# Patient Record
Sex: Female | Born: 1940 | Hispanic: No | Marital: Single | Smoking: Never smoker
Health system: Southern US, Community
[De-identification: ages and names within clinical notes are randomized; demographics above are authoritative.]

## PROBLEM LIST (undated history)

## (undated) DIAGNOSIS — F32A Depression, unspecified: Secondary | ICD-10-CM

## (undated) DIAGNOSIS — M810 Age-related osteoporosis without current pathological fracture: Secondary | ICD-10-CM

## (undated) DIAGNOSIS — M1990 Unspecified osteoarthritis, unspecified site: Secondary | ICD-10-CM

## (undated) DIAGNOSIS — I10 Essential (primary) hypertension: Secondary | ICD-10-CM

## (undated) DIAGNOSIS — F329 Major depressive disorder, single episode, unspecified: Secondary | ICD-10-CM

## (undated) DIAGNOSIS — K635 Polyp of colon: Secondary | ICD-10-CM

## (undated) HISTORY — DX: Polyp of colon: K63.5

## (undated) HISTORY — DX: Age-related osteoporosis without current pathological fracture: M81.0

## (undated) HISTORY — PX: BREAST SURGERY: SHX581

## (undated) HISTORY — DX: Major depressive disorder, single episode, unspecified: F32.9

## (undated) HISTORY — PX: APPENDECTOMY: SHX54

## (undated) HISTORY — DX: Essential (primary) hypertension: I10

## (undated) HISTORY — DX: Depression, unspecified: F32.A

## (undated) HISTORY — DX: Unspecified osteoarthritis, unspecified site: M19.90

---

## 1998-06-27 ENCOUNTER — Other Ambulatory Visit: Admission: RE | Admit: 1998-06-27 | Discharge: 1998-06-27 | Payer: Self-pay | Admitting: Gynecology

## 1999-07-28 ENCOUNTER — Other Ambulatory Visit: Admission: RE | Admit: 1999-07-28 | Discharge: 1999-07-28 | Payer: Self-pay | Admitting: Gynecology

## 2001-03-16 ENCOUNTER — Other Ambulatory Visit: Admission: RE | Admit: 2001-03-16 | Discharge: 2001-03-16 | Payer: Self-pay | Admitting: Gynecology

## 2007-12-30 ENCOUNTER — Ambulatory Visit: Payer: Self-pay | Admitting: Gastroenterology

## 2007-12-30 LAB — CONVERTED CEMR LAB
Basophils Absolute: 0 10*3/uL (ref 0.0–0.1)
Eosinophils Absolute: 0.2 10*3/uL (ref 0.0–0.6)
Eosinophils Relative: 2.2 % (ref 0.0–5.0)
Folate: 15.5 ng/mL
MCHC: 33.2 g/dL (ref 30.0–36.0)
Monocytes Absolute: 1.1 10*3/uL — ABNORMAL HIGH (ref 0.2–0.7)
Neutrophils Relative %: 36.3 % — ABNORMAL LOW (ref 43.0–77.0)
RBC: 4.82 M/uL (ref 3.87–5.11)
RDW: 13.7 % (ref 11.5–14.6)
Saturation Ratios: 30.3 % (ref 20.0–50.0)
Vitamin B-12: 470 pg/mL (ref 211–911)

## 2008-01-11 ENCOUNTER — Encounter (INDEPENDENT_AMBULATORY_CARE_PROVIDER_SITE_OTHER): Payer: Self-pay | Admitting: *Deleted

## 2008-01-11 ENCOUNTER — Ambulatory Visit: Payer: Self-pay | Admitting: Gastroenterology

## 2008-01-11 ENCOUNTER — Encounter: Payer: Self-pay | Admitting: Gastroenterology

## 2008-11-13 ENCOUNTER — Encounter: Payer: Self-pay | Admitting: Gynecology

## 2008-11-13 ENCOUNTER — Other Ambulatory Visit: Admission: RE | Admit: 2008-11-13 | Discharge: 2008-11-13 | Payer: Self-pay | Admitting: Gynecology

## 2008-11-13 ENCOUNTER — Ambulatory Visit: Payer: Self-pay | Admitting: Gynecology

## 2008-11-13 ENCOUNTER — Encounter: Payer: Self-pay | Admitting: Gastroenterology

## 2008-11-20 ENCOUNTER — Encounter: Payer: Self-pay | Admitting: Gastroenterology

## 2008-11-20 ENCOUNTER — Ambulatory Visit: Payer: Self-pay | Admitting: Gynecology

## 2008-11-20 ENCOUNTER — Telehealth: Payer: Self-pay | Admitting: Gastroenterology

## 2008-11-22 ENCOUNTER — Encounter: Payer: Self-pay | Admitting: Gastroenterology

## 2008-11-23 ENCOUNTER — Encounter (INDEPENDENT_AMBULATORY_CARE_PROVIDER_SITE_OTHER): Payer: Self-pay | Admitting: *Deleted

## 2008-11-23 ENCOUNTER — Ambulatory Visit (HOSPITAL_COMMUNITY): Admission: RE | Admit: 2008-11-23 | Discharge: 2008-11-23 | Payer: Self-pay | Admitting: Gynecology

## 2008-11-27 ENCOUNTER — Telehealth (INDEPENDENT_AMBULATORY_CARE_PROVIDER_SITE_OTHER): Payer: Self-pay | Admitting: *Deleted

## 2008-12-03 DIAGNOSIS — K644 Residual hemorrhoidal skin tags: Secondary | ICD-10-CM | POA: Insufficient documentation

## 2008-12-03 DIAGNOSIS — I10 Essential (primary) hypertension: Secondary | ICD-10-CM | POA: Insufficient documentation

## 2008-12-03 DIAGNOSIS — R740 Nonspecific elevation of levels of transaminase and lactic acid dehydrogenase [LDH]: Secondary | ICD-10-CM

## 2008-12-03 DIAGNOSIS — D126 Benign neoplasm of colon, unspecified: Secondary | ICD-10-CM

## 2008-12-03 DIAGNOSIS — K5730 Diverticulosis of large intestine without perforation or abscess without bleeding: Secondary | ICD-10-CM | POA: Insufficient documentation

## 2008-12-03 DIAGNOSIS — R7401 Elevation of levels of liver transaminase levels: Secondary | ICD-10-CM | POA: Insufficient documentation

## 2008-12-03 DIAGNOSIS — M129 Arthropathy, unspecified: Secondary | ICD-10-CM | POA: Insufficient documentation

## 2008-12-06 ENCOUNTER — Ambulatory Visit: Payer: Self-pay | Admitting: Gastroenterology

## 2008-12-06 DIAGNOSIS — K625 Hemorrhage of anus and rectum: Secondary | ICD-10-CM | POA: Insufficient documentation

## 2008-12-06 DIAGNOSIS — K648 Other hemorrhoids: Secondary | ICD-10-CM | POA: Insufficient documentation

## 2008-12-11 ENCOUNTER — Ambulatory Visit: Payer: Self-pay | Admitting: Gynecology

## 2009-01-22 ENCOUNTER — Ambulatory Visit: Payer: Self-pay | Admitting: Gastroenterology

## 2009-02-01 ENCOUNTER — Ambulatory Visit: Payer: Self-pay | Admitting: Gastroenterology

## 2009-02-15 ENCOUNTER — Ambulatory Visit: Payer: Self-pay | Admitting: Gynecology

## 2009-02-28 ENCOUNTER — Ambulatory Visit: Payer: Self-pay | Admitting: Gynecology

## 2009-07-09 ENCOUNTER — Encounter: Admission: RE | Admit: 2009-07-09 | Discharge: 2009-07-09 | Payer: Self-pay | Admitting: Family Medicine

## 2009-11-14 ENCOUNTER — Other Ambulatory Visit: Admission: RE | Admit: 2009-11-14 | Discharge: 2009-11-14 | Payer: Self-pay | Admitting: Gynecology

## 2009-11-14 ENCOUNTER — Ambulatory Visit: Payer: Self-pay | Admitting: Gynecology

## 2010-02-12 ENCOUNTER — Encounter (INDEPENDENT_AMBULATORY_CARE_PROVIDER_SITE_OTHER): Payer: Self-pay | Admitting: *Deleted

## 2010-10-21 ENCOUNTER — Emergency Department (HOSPITAL_COMMUNITY)
Admission: EM | Admit: 2010-10-21 | Discharge: 2010-10-21 | Payer: Self-pay | Source: Home / Self Care | Admitting: Emergency Medicine

## 2010-12-09 NOTE — Procedures (Signed)
Summary: colonoscopy  Patient Name: Mary Hunt, Mary Hunt MRN:  Procedure Procedures: Colonoscopy CPT: 14782.  Personnel: Endoscopist: Vania Rea. Jarold Motto, MD.  Referred By: Laban Emperor. Cloward, MD.  Exam Location: Exam performed in Outpatient Clinic. Outpatient  Patient Consent: Procedure, Alternatives, Risks and Benefits discussed, consent obtained, from patient. Consent was obtained by the RN.  Indications Symptoms: Hematochezia.  History  Current Medications: Patient is not currently taking Coumadin.  Allergies: Allergic to PCN.  Pre-Exam Physical: Performed Jan 11, 2008. Entire physical exam was normal.  Comments: Pt. history reviewed/updated, physical exam performed prior to initiation of sedation? yes Exam Exam: Extent of exam reached: Cecum, extent intended: Cecum.  The cecum was identified by appendiceal orifice and IC valve. Patient position: on left side. Time to Cecum: 00:05:09. Time for Withdrawl: 00:10:58. Colon retroflexion performed. Images taken. ASA Classification: I. Tolerance: excellent.  Monitoring: Pulse and BP monitoring, Oximetry used. Supplemental O2 given. at 2 Liters.  Colon Prep Used Golytely for colon prep. Prep results: poor.  Sedation Meds: Patient assessed and found to be appropriate for moderate (conscious) sedation. Sedation was managed by the Endoscopist. Fentanyl 50 mcg. given IV. Versed 7 mg. given IV.  Instrument(s): CF 140L. Serial [id number].  Findings - DIVERTICULOSIS: Cecum to Transverse Colon. Not bleeding. ICD9: Diverticulosis, Colon: 562.10.  - DIVERTICULOSIS: Descending Colon to Sigmoid Colon. Not bleeding. ICD9: Diverticulosis, Colon: 562.10.  - MULTIPLE POLYPS: Descending Colon to Sigmoid Colon. minimum size 3 mm, maximum size 7 mm. Procedure:  snare with cautery, removed, Polyp retrieved, Polyps sent to pathology.  - POLYP: Ascending Colon, Maximum size: 3 mm. sessile polyp. Procedure:  snare with cautery, removed, not  retrieved, ICD9: Colon Polyps: 211.3.  - OTHER FINDING: Cecum. Comments: Solid feces in cecum...poor exam...  - HEMORRHOIDS: External. Size: Medium. Not bleeding. Not thrombosed. ICD9: Hemorrhoids, External: 455.3.    Comments: Sigmoid full of feces... Assessment  Diagnoses: 211.3: Colon Polyps.  562.10: Diverticulosis, Colon.  455.3: Hemorrhoids, External.   Comments: Multiple polyps and limited exam per POOR PREP... Events  Unplanned Interventions: No intervention was required.  Plans  Post Exam Instructions: No aspirin or non-steroidal containing medications: 2 weeks.  Medication Plan: Await pathology. Continue current medications.  Patient Education: Patient given standard instructions for: Polyps. Diverticulosis. Hemorrhoids. Patient instructed to get routine colonoscopy every 1 years.  Disposition: After procedure patient sent to recovery. After recovery patient sent home.  Scheduling/Referral: Follow-Up prn. Await pathology to schedule patient.

## 2010-12-09 NOTE — Letter (Signed)
Summary: Appointment - Missed  [facility], Main Office  [address]   [place], [place] [address]   Phone: [phone]  Fax: [phone]     February 12, 2010 MRN: [id number]   Mary Hunt [address] [place], [place]  [address]   Dear Mary Hunt,  Our records indicate you missed your appointment on 02/06/2010 with Dr. Shirlee Latch. It is very important that we reach you to reschedule this appointment. We look forward to participating in your health care needs. Please contact us at the number listed above at your earliest convenience to reschedule this appointment.     Sincerely,   Migdalia Dk [facility] Scheduling Team

## 2011-01-20 LAB — BASIC METABOLIC PANEL
BUN: 13 mg/dL (ref 6–23)
Chloride: 106 mEq/L (ref 96–112)
Creatinine, Ser: 0.8 mg/dL (ref 0.4–1.2)
GFR calc non Af Amer: >60 mL/min (ref >60)
Glucose, Bld: 101 mg/dL — ABNORMAL HIGH (ref 70–99)
Potassium: 3.9 mEq/L (ref 3.5–5.1)
Sodium: 139 mEq/L (ref 135–145)

## 2011-01-20 LAB — CBC
HCT: 42.1 % (ref 36.0–46.0)
MCHC: 32.3 g/dL (ref 30.0–36.0)
RBC: 4.89 MIL/uL (ref 3.87–5.11)
WBC: 10 10*3/uL (ref 4.0–10.5)

## 2011-01-20 LAB — DIFFERENTIAL
Basophils Absolute: 0.1 10*3/uL (ref 0.0–0.1)
Eosinophils Absolute: 0.1 10*3/uL (ref 0.0–0.7)
Eosinophils Relative: 1 % (ref 0–5)
Lymphocytes Relative: 43 % (ref 12–46)
Monocytes Absolute: 0.9 10*3/uL (ref 0.1–1.0)
Neutrophils Relative %: 47 % (ref 43–77)

## 2011-01-20 LAB — POCT CARDIAC MARKERS

## 2011-03-24 NOTE — Assessment & Plan Note (Signed)
[facility]                         GASTROENTEROLOGY OFFICE NOTE   Mary Hunt                            MRN:          [id number]  DATE:12/30/2007                            DOB:          04-10-1941    Mary Hunt is a 70 year old [place] female, referred for evaluation of  asymptomatic rectal bleeding over the last year.   Mary Hunt does not understand or speak English and her interview  instructions are related through her daughter, who speaks fluent  [place].   Mary Hunt has had some intermittent asymptomatic rectal bleeding for the  last year without abdominal or rectal pain.  She had been using over-the-  counter Preparation H because of hemorrhoids with mild improvement.  She denies any upper GI or hepatobiliary complaints or other medical  problems of note.  She does have a history of suspected essential  hypertension, hypercholesterolemia, degenerative arthritis, has had  previous benign breast surgery and also has had a previous hysterectomy,  many years ago.   She denies abdominal pain, bowel irregularity, fever, chills or other  systemic problems.  She is followed at [facility] and has been  prescribed p.r.n. Restoril.  Recent urinalysis and lipid profile were  fairly unremarkable, including urine culture.  I cannot see results of a  CBC or liver profile.   MEDICATIONS:  Tylenol arthritis and p.r.n. ibuprofen.   She has a previous history of PENICILLIN allergy.   FAMILY HISTORY:  Noncontributory.  She has a daughter who is diabetic  and is on dialysis.   SOCIAL HISTORY:  She is single and lives with her daughter.  She has a  high school education.  She does not smoke or use ethanol.   REVIEW OF SYSTEMS:  Except for diffuse arthralgias, is unremarkable.  She denies cardiovascular, pulmonary, neurologic or psychiatric problem.   EXAM:  She is a healthy-appearing, Hispanic female, appearing her stated  age.  She is 5 feet  tall and weighs 161 pounds.  Blood pressure is 138/84 and  pulse was 80 and regular.  I could not appreciate stigmata of chronic liver disease or thyromegaly.  Her chest was clear and she was in a regular rhythm without murmurs,  gallops or rubs.  I could not appreciate hepatosplenomegaly, abdominal masses or  tenderness.  Bowel sounds were normal.  Mental status was clear.  Peripheral extremities were normal.  Inspection of the rectum was unremarkable, as was rectal exam.  I could  not appreciate any fissures or fistulae.  She did have a posterior  external hemorrhoid.  There were no rectal masses or tenderness and  stool was guaiac-negative.   ASSESSMENT:  I suspect Mary Hunt is having some intermittent  hemorrhoidal  rectal bleeding, but she needs colonoscopy to exclude  rectosigmoid polyps or carcinoma.   RECOMMENDATIONS:  1. Local anal care and Analpram cream.  2. Check CBC and anemia profile.  3. Outpatient colonoscopy at her convenience.     Vania Rea. Jarold Motto, MD, Caleen Essex, FAGA  Electronically Signed    DRP/MedQ  DD: 12/30/2007  DT: 12/30/2007  Job #: [id number]   cc:   Gabriel Earing, M.D.

## 2011-04-30 ENCOUNTER — Other Ambulatory Visit: Payer: Self-pay | Admitting: Gynecology

## 2011-04-30 ENCOUNTER — Other Ambulatory Visit (HOSPITAL_COMMUNITY)
Admission: RE | Admit: 2011-04-30 | Discharge: 2011-04-30 | Disposition: A | Discharge status: Home / Self Care | Payer: Medicare Other | Source: Ambulatory Visit | Attending: Gynecology | Admitting: Gynecology

## 2011-04-30 ENCOUNTER — Encounter (INDEPENDENT_AMBULATORY_CARE_PROVIDER_SITE_OTHER): Payer: Medicare Other | Admitting: Gynecology

## 2011-04-30 DIAGNOSIS — Z124 Encounter for screening for malignant neoplasm of cervix: Secondary | ICD-10-CM

## 2011-04-30 DIAGNOSIS — N952 Postmenopausal atrophic vaginitis: Secondary | ICD-10-CM

## 2011-04-30 DIAGNOSIS — R635 Abnormal weight gain: Secondary | ICD-10-CM

## 2011-04-30 DIAGNOSIS — Z1211 Encounter for screening for malignant neoplasm of colon: Secondary | ICD-10-CM

## 2011-05-07 ENCOUNTER — Encounter (INDEPENDENT_AMBULATORY_CARE_PROVIDER_SITE_OTHER): Payer: Medicare Other

## 2011-05-07 DIAGNOSIS — M949 Disorder of cartilage, unspecified: Secondary | ICD-10-CM

## 2011-05-07 DIAGNOSIS — E559 Vitamin D deficiency, unspecified: Secondary | ICD-10-CM

## 2011-05-08 ENCOUNTER — Other Ambulatory Visit: Payer: Medicare Other

## 2011-06-09 ENCOUNTER — Ambulatory Visit (INDEPENDENT_AMBULATORY_CARE_PROVIDER_SITE_OTHER): Payer: Medicare Other | Admitting: Gynecology

## 2011-06-09 ENCOUNTER — Encounter: Payer: Self-pay | Admitting: Gynecology

## 2011-06-09 VITALS — BP 128/86

## 2011-06-09 DIAGNOSIS — M899 Disorder of bone, unspecified: Secondary | ICD-10-CM

## 2011-06-09 DIAGNOSIS — M8580 Other specified disorders of bone density and structure, unspecified site: Secondary | ICD-10-CM

## 2011-06-09 DIAGNOSIS — L309 Dermatitis, unspecified: Secondary | ICD-10-CM

## 2011-06-09 DIAGNOSIS — L259 Unspecified contact dermatitis, unspecified cause: Secondary | ICD-10-CM

## 2011-06-09 DIAGNOSIS — F419 Anxiety disorder, unspecified: Secondary | ICD-10-CM

## 2011-06-09 DIAGNOSIS — M949 Disorder of cartilage, unspecified: Secondary | ICD-10-CM

## 2011-06-09 DIAGNOSIS — F411 Generalized anxiety disorder: Secondary | ICD-10-CM

## 2011-06-09 MED ORDER — ALENDRONATE SODIUM 70 MG PO TABS: 70.0000 mg | ORAL_TABLET | ORAL | Status: DC

## 2011-06-09 MED ORDER — ALPRAZOLAM 0.25 MG PO TABS: 0.2500 mg | ORAL_TABLET | Freq: Every evening | ORAL | Status: AC | PRN

## 2011-06-09 MED ORDER — HYDROCORTISONE VALERATE 0.2 % EX OINT: TOPICAL_OINTMENT | Freq: Two times a day (BID) | CUTANEOUS | Status: AC

## 2011-06-09 NOTE — Progress Notes (Signed)
Patient presented to the office to discuss several issues #1 she noticed a growth from the inner thigh a few weeks ago. On inspection appears to be a skin tag. The area was secured with 4-0 Vicryl suture in a explained to the patient that this will sloughf of by itself. #2 she was complaining of this rash on the lateral aspects of both forearms left greater than right it appears to be some form of either a contact dermatitis early signs of psoriasis. She will be placed on Westcort topical steroid cream to apply to 3 times a day for the next 5-7 days if there's no improvement she'll be referred to a dermatologist for further evaluation. Third issue discussed today was her bone density study and her history of vitamin D deficiency. She is taking vitamin D 2000 units q. daily as previously recommended and her most recent vitamin D level was normal at 33. Her bone density study was done here in our office on June 28 was compared with the previous skin of February 2010 her lowest T score in the AP spine was -2.2 and she had significant decrease on her bone marrow densities on the left and right hip respectively of greater than 5%. Since she is 70 years of age regardless of the thorax analysis demonstrated some partial values were going to initiate antiresorptive agent such as Fosamax 70 mg one by mouth weekly and will followup in the bone density study in one week. The risks benefits and pros and cons of as a result the cages were discussed with the patient and literature formation in Spanish was provided on osteoporosis. I've explained to her that she needs to continue to engage and weight-bearing exercise 30-45 minutes 3-4 times a week and some lichenification is 3-4 times a week will follow up with a bone density study in one year to monitor response to treatment albeit above was provided in Spanish in written format the patient all questions are as her we'll follow accordingly.

## 2011-06-09 NOTE — Patient Instructions (Signed)
Tomar la vitamina D 2,000 unidades una tableta diaria. El Fosomax es para los huesos tambien y te tomas una tableta semanal primera hora del dia antes de comer o tomar algo por 30 minutos y no acostarte.Toula Moos crema para la piel aplicar dos a tres veces al dia. El xanx es para la ansiedad te puedes tomar una diaria o dos veces al dia. Buena suerte en to examen!

## 2011-07-25 ENCOUNTER — Other Ambulatory Visit: Payer: Self-pay | Admitting: Gynecology

## 2011-07-27 ENCOUNTER — Telehealth: Payer: Self-pay | Admitting: *Deleted

## 2011-07-27 NOTE — Telephone Encounter (Signed)
Message copied by Aura Camps on Mon Jul 27, 2011 12:24 PM ------      Message from: Ok Edwards      Created: Mon Jul 27, 2011 12:18 PM       Mary Hunt, please call in prescription refill for one month for this patient. Please inform her that the physician who put her on her hypertensive medication should be the one to refill those medications for her.

## 2011-07-27 NOTE — Telephone Encounter (Signed)
Spoke with to granddaughter regarding the below message. She called pharmacy requesting rx. She will tell the pt the below note.

## 2011-08-04 ENCOUNTER — Other Ambulatory Visit: Payer: Self-pay | Admitting: *Deleted

## 2011-08-04 NOTE — Telephone Encounter (Signed)
Pharmacy faxed over request for test strips for blood sugar. Called pharmacy to let them know that Rx should go to PCP.

## 2011-11-17 ENCOUNTER — Encounter: Payer: Self-pay | Admitting: Gynecology

## 2012-06-11 ENCOUNTER — Other Ambulatory Visit: Payer: Self-pay | Admitting: Gynecology

## 2012-10-03 ENCOUNTER — Other Ambulatory Visit: Payer: Self-pay | Admitting: Gynecology

## 2012-10-12 ENCOUNTER — Encounter: Payer: Self-pay | Admitting: Gynecology

## 2012-10-12 ENCOUNTER — Ambulatory Visit (INDEPENDENT_AMBULATORY_CARE_PROVIDER_SITE_OTHER): Payer: Medicare Other | Admitting: Gynecology

## 2012-10-12 VITALS — BP 124/76 | Ht <= 58 in | Wt 149.0 lb

## 2012-10-12 DIAGNOSIS — M8580 Other specified disorders of bone density and structure, unspecified site: Secondary | ICD-10-CM

## 2012-10-12 DIAGNOSIS — L409 Psoriasis, unspecified: Secondary | ICD-10-CM | POA: Insufficient documentation

## 2012-10-12 DIAGNOSIS — R51 Headache: Secondary | ICD-10-CM

## 2012-10-12 DIAGNOSIS — L408 Other psoriasis: Secondary | ICD-10-CM

## 2012-10-12 DIAGNOSIS — N951 Menopausal and female climacteric states: Secondary | ICD-10-CM

## 2012-10-12 DIAGNOSIS — M899 Disorder of bone, unspecified: Secondary | ICD-10-CM

## 2012-10-12 DIAGNOSIS — Z23 Encounter for immunization: Secondary | ICD-10-CM

## 2012-10-12 MED ORDER — ALENDRONATE SODIUM 70 MG PO TABS: 70.0000 mg | ORAL_TABLET | ORAL | Status: DC

## 2012-10-12 MED ORDER — IBUPROFEN 600 MG PO TABS: 600.0000 mg | ORAL_TABLET | Freq: Three times a day (TID) | ORAL | Status: AC | PRN

## 2012-10-12 MED ORDER — ASPIRIN 81 MG PO TABS: 81.0000 mg | ORAL_TABLET | Freq: Every day | ORAL | Status: DC

## 2012-10-12 NOTE — Patient Instructions (Signed)
Vacuna desactivada contra la influenza, Lo que usted necesita saber (Inactivated Influenza Vaccine, What You Need to Know) POR QU VACUNARSE?  La influenza (conocida como gripe o "flu") es una enfermedad contagiosa.  Es causada por el virus de la influenza, que se puede transmitir al toser, Engineering geologist o mediante las secreciones nasales.  A cualquiera le puede dar influenza, pero los ndices de infeccin son [employer]Ex nios. La Harley[employer]son de las personas solo experimentan sntomas por unos pocos das e incluyen:  Teacher, English as a foreign language o escalofros.  Dolor de Advertising copywriter.  Dolores musculares.  Cansancio.  Tos.  Dolor de [place].  Nariz moquienta o congestionada. Otras enfermedades pueden DIRECTV mismos sntomas y a menudo se confunden con la influenza. Los nios pequeos, las Smith International de 65 aos de edad, las mujeres embarazadas y las personas con ciertas condiciones de salud, como enfermedades del corazn, pulmn o rin o un sistema inmunolgico debilitado, se pueden enfermar mucho ms. La influenza puede causar fiebre alta y neumona y puede empeorar condiciones de salud preexistentes. Puede causar diarrea y convulsiones en los nios. Miles de personas mueren cada ao por la influenza y muchas ms requieren hospitalizacin. Si se vacuna, puede protegerse usted mismo y Arts administrator a otros. VACUNA DESACTIVADA CONTRA LA INFLUENZA  Hay dos tipos de vacuna contra la influenza:  La vacuna inactivada (el virus est inactivo), de la "vacuna contra la gripe" se aplica con una aguja.  La vacuna viva atenuada (debilitado), que see aplica como roco en las fosas nasales. Esta vacuna se describe en una Hoja de Informacin sobre las [place], por separado. Hay una "dosis ms alta" de vacuna desactivada disponible para personas mayores de 65 aos. Para ms informacin, consulte a su doctor.  Cada ao los cientficos tratan de que los virus de la vacuna coincidan con los que tienen ms  probabilidades de causar la influenza ese ao. La vacuna contra la influenza no prevendr otras enfermedades causadas por otros virus, incluyendo los virus de influenza que no estn incluidos en la vacuna. Despus de la vacunacin, toma hasta 2 semanas para desarrollar proteccin. La proteccin dura hasta un ao. Algunas vacunas desactivadas contra la influenza contienen un conservante llamado timerosal. La vacuna libre de timerosal tambin est disponible. Consulte a su doctor para ms informacin. QUINES DEBEN RECIBIR LA VACUNA DESACTIVADA CONTRA LA INFLUENZA Y CUNDO? QUINES  Todas las Smith International de 6 meses de edad deben recibir la vacuna contra la influenza.  La vacunacin es especialmente importante para las personas con mayor riesgo de experimentar un caso grave de influenza y las que estn en contacto directo con ellas, incluyendo al personal mdico, y las personas en contacto cercano con bebs menores de 6 meses de edad. CUNDO Reciba la vacuna tan pronto como est disponible. Esto le dar la proteccin necesaria en caso de que la temporada de influenza llegue temprano. Puede vacunarse durante todo el tiempo en el que la enfermedad siga ocurriendo en su comunidad. La influenza puede ocurrir a Customer service manager, pero la [place] de influenza ocurre desde octubre [employer] Corporation. En las ltimas temporadas, la mayora de las infecciones han ocurrido en enero y Research scientist (physical sciences). Vacunndose Science Applications International, o an despus, ser beneficioso en casi todos los [place]. Los adultos y los nios mayores requieren una dosis de la vacuna contra la influenza cada ao. Sin embargo, algunos nios menores de 9 aos de edad [address] dosis para estar protegidos. Consulte a su doctor. Se puede dar la vacuna contra la influenza a la misma  vez que otras vacunas, incluyendo la vacuna antineumoccica. ALGUNAS PERSONAS NO DEBEN RECIBIR LA VACUNA DESACTIVADA CONTRA LA INFLUENZA O DEBEN ESPERAR  Diga a su doctor si tiene  cualquier alergia grave (que amenaza la vida), incluyendo alergia grave a los [place]. Una grave alergia a cualquier componente de la vacuna puede ser razn para no vacunarse. Las Therapist, art a la vacuna contra la influenza son poco comunes.  Diga a su doctor si alguna vez ha tenido una reaccin grave despus de haber recibido una dosis de la vacuna contra la influenza.  Diga a su doctor si alguna vez ha tenido el sndrome de Pension scheme manager (una enfermedad paraltica grave, tambin conocida como GBS). Su doctor le puede ayudar a decidir si es recomendable vacunarse.  Las personas moderadamente o muy enfermas por lo general deben esperar hasta recuperarse antes de vacunarse contra la influenza. Si est enfermo, hable con su doctor sobre si debe cambiar la cita para vacunarse. Las personas con una enfermedad leve por lo general se pueden vacunar. CULES SON LOS RIESGOS DE LA VACUNA DESACTIVADA CONTRA LA INFLUENZA? Los vacunas, como cualquier [place], pueden causar problemas serios, como Therapist, art graves. El riesgo de que la vacuna cause un dao serio, o la [place], es sumamente pequeo. Problemas serios de la vacuna desactivada contra la influenza ocurren muy rara vez. Los virus en la vacuna desactivada estn muertos o sea que no se puede enfermar de influenza mediante la vacuna. Problemas leves:  Molestia, enrojecimiento o hinchazn en el lugar donde lo vacunaron.  Ronquera; dolor, enrojecimiento y The Pro[employer]ble ojos; tos.  Grant Ruts.  Dolores.  Dolor de [place].  Picazn.  Cansancio. Si estos problemas ocurren, en general comienzan poco tiempo despus de vacunarse y duran 1  2 das. Problemas moderados: Los nios pequeos que reciben la vacuna contra la influenza desactivada y la vacuna antineumoccica (PCV13) durante la misma cita parecen correr mayor riego de tener convulsiones por causa de fiebre. Consulte a su doctor para ms informacin. Diga a su doctor si el  nio que est recibiendo la vacuna contra la influenza ha tenido una convulsin. Problemas graves:  Las reacciones alrgicas que amenazan la vida ocurren muy rara vez despus de la vacunacin. Si ocurren, por lo general es a los W[employer] Corporation o a las pocas horas de haberse vacunado.  En 1976, un tipo de vacuna contra la influenza (gripe porcina) estuvo asociado al sndrome de Guillain-Barr (GBS). Desde entonces, las vacunas contra la influenza no se han asociado claramente al GBS. Sin embargo, si hay un riesgo de GBS por las vacunas contra la influenza que se usan actualmente, no debe ser ms de 1  2 casos por milln de personas vacunadas. Eso es [employer]co Wholesale que el riesgo de tener una influenza fuerte, que se puede prevenir con vacunacin. Siempre se seguir prestando atencin a la seguridad de las vacunas. Para ms informacin visite:  PrintingMaps.se y  https://www.farmer-stevens.info/ [place] marca de la vacuna desactivada contra la influenza, llamada Afluria, no se debe dar a nios menores de 8 aos de edad, con la excepcin de circunstancias especiales. En [place] una vacuna relacionada estuvo asociada a fiebre y convulsiones febriles en nios pequeos. Su doctor le puede proporcionar ms informacin. QU PASA SI HAY UNA REACCIN GRAVE? A qu debo prestar atencin? Cualquier estado poco habitual, como fiebre alta o cambios en el comportamiento. Los signos de [place] reaccin alrgica grave pueden incluir dificultad para respirar, ronquera o sibilancias, ronchas, palidez, debilidad, latidos cardacos acelerados, o mareos. Qu debo  hacer?  Llame a un doctor o lleve a la persona inmediatamente a un doctor.  Dga a su doctor lo que ocurri, la fecha y hora en que ocurri, y cuando recibi la vacuna.  Pida a su mdico, enfermero o al departamento de salud, que informe sobre la reaccin llenando un formulario del Sistema de  Informacin de Reacciones Adversos a las Administrator, arts (VAERS, por sus siglas en ingls). O, puede presentar este informe mediante el sitio Web de VAERS, en:www.vaers.LAgents.no o llamando al: 220-680-3380. VAERS no proporciona consejos mdicos. PROGRAMA NACIONAL DE COMPENSACIN POR LESIONES CAUSADAS POR VACUNAS El [place] de Compensacin por Lesiones Causadas por las Vacunas (VICP) fue creado en 1986.  Las personas que piensan haber sido lesionadas por alguna vacuna pueden aprender acerca del programa y cmo presentar una reclamacin llamando al: [phone] o visitando el sitio Web de VICP Greens[place]Automobile.ch CMO Roxan Diesel MS INFORMACIN?  Consulte a su doctor. Le pueden dar el folleto de informacin que viene con la vacuna o sugerirle otras fuentes de informacin.  Llame al departamento de salud local o estatal.  Comunquese con los Centros para el Control y la Prevencin de [facility] (CDC):  Llame al [phone] (1-800-CDC-INFO) o  Visite el sitio Web de los CDC en BiotechRoom.com.cy CDC Inactivated Influenza Vaccine-Spanish VIS (05/11/11) Document Released: 01/22/2009 Document Revised: 01/18/2012 [facility] Patient Information 2013 [place], [place].

## 2012-10-12 NOTE — Progress Notes (Signed)
Mary Hunt [birth year] [id number]   History:    71 y.o.  for GYN exam. Patient had been seen the office in July of 2012 and appeared that on her right arm there was some form of either contact dermatitis or early signs of psoriasis and she was given a trial of Westcort cream to apply 3 times a day for 7-10 days. She states that she has had minimal response and I had recommended she followup with her dermatologist. Patient has a past history vitamin D deficiency and she's been taking  her vitamin D and last year her vitamin D level was normal at 33.Her bone density study was done here in our office on June 28 was compared with the previous study of February 2010 her lowest T score in the AP spine was -2.2 and she had significant decrease on her bone mineral densities on the left and right hip respectively of greater than 5%. Since she is 71 years of age regardless of the Frax analysis which demonstrated sub-threshold 10 year probability fracture risk analysis she was started on antiresorptive agent Fosamax 70 mg by mouth q. weekly. Her primary physician Dr. Wallace Cullens did all her lab work 6 months ago and he is following her for hypertension and type 2 diabetes. She is complaining at times of temporal-like headaches but no visual disturbance. Review records indicated she had a colonoscopy 2009 demonstrated benign polyps. Mammogram 2013 normal. Patient requesting a flu vaccine.   Past medical history,surgical history, family history and social history were all reviewed and documented in the EPIC chart.  Gynecologic History Patient's last menstrual period was 06/09/1991. Contraception: post menopausal status Last Pap: 2012. Results were: normal Last mammogram: 2013. Results were: normal  Obstetric History OB History    Grav Para Term Preterm Abortions TAB SAB Ect Mult Living   3 3 3       3      # Outc Date GA Lbr Len/2nd Wgt Sex Del Anes PTL Lv   1 TRM     F SVD  No Yes   2 TRM     M SVD  No Yes   3 TRM      M SVD  No Yes       ROS: A ROS was performed and pertinent positives and negatives are included in the history.  GENERAL: No fevers or chills. HEENT: No change in vision, no earache, sore throat or sinus congestion. NECK: No pain or stiffness. CARDIOVASCULAR: No chest pain or pressure. No palpitations. PULMONARY: No shortness of breath, cough or wheeze. GASTROINTESTINAL: No abdominal pain, nausea, vomiting or diarrhea, melena or bright red blood per rectum. GENITOURINARY: No urinary frequency, urgency, hesitancy or dysuria. MUSCULOSKELETAL: No joint or muscle pain, no back pain, no recent trauma. DERMATOLOGIC: No rash, no itching, no lesions. ENDOCRINE: No polyuria, polydipsia, no heat or cold intolerance. No recent change in weight. HEMATOLOGICAL: No anemia or easy bruising or bleeding. NEUROLOGIC: No headache, seizures, numbness, tingling or weakness. PSYCHIATRIC: No depression, no loss of interest in normal activity or change in sleep pattern.     Exam: chaperone present  BP 124/76  Ht 4\' 10"  (1.473 m)  Wt 149 lb (67.586 kg)  BMI 31.14 kg/m2  LMP 06/09/1991  Body mass index is 31.14 kg/(m^2).  General appearance : Well developed well nourished female. No acute distress HEENT: Neck supple, trachea midline, no carotid bruits, no thyroidmegaly Lungs: Clear to auscultation, no rhonchi or wheezes, or rib retractions  Heart: Regular rate  and rhythm, no murmurs or gallops Breast:Examined in sitting and supine position were symmetrical in appearance, no palpable masses or tenderness,  no skin retraction, no nipple inversion, no nipple discharge, no skin discoloration, no axillary or supraclavicular lymphadenopathy Abdomen: no palpable masses or tenderness, no rebound or guarding Extremities: no edema or skin discoloration or tenderness, lateral aspect of right forearm several psoriasis-like lesions.  Pelvic:  Bartholin, Urethra, Skene Glands: Within normal limits             Vagina: No  gross lesions or discharge  Cervix: No gross lesions or discharge  Uterus  Axial, normal size, shape and consistency, non-tender and mobile  Adnexa  Without masses or tenderness  Anus and perineum  normal   Rectovaginal  normal sphincter tone without palpated masses or tenderness             Hemoccult cards provided     Assessment/Plan:  71 y.o. female for GYN exam. Patient with no prior history of Pap smear. New guidelines discussed. Patient was no longer needs Pap smears. 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 Patient will schedule her mammogram for next month. She will also schedule a bone density study was in the next few weeks to see the response to therapy since she was started on Fosamax one year ago. Patient was instructed to continue to take her calcium and vitamin D and engage in weightbearing exercises 30-45 minutes 3 or 4 times a week. Patient to receive the flu vaccine today. Hemoccult cards provided for the patient to submit to the office for testing. Patient to continue her Fosamax 70 mg. Patient fully aware this medication will be taking for no longer than 6 years. Patient fully where potential risk of osteonecrosis of the jaw as well as spontaneous subtrochanteric fractures. Patient with prescription for Motrin 600 mg to take 1 by mouth 3 times a day when necessary. Patient will need a colonoscopy in 2014.   Ok Edwards MD, 8:53 PM 10/12/2012

## 2012-10-13 ENCOUNTER — Telehealth: Payer: Self-pay | Admitting: *Deleted

## 2012-10-13 NOTE — Telephone Encounter (Signed)
Pt appointment for Dr. Nita Sells on 10/17/12 @ 11:30. Notes faxed to MD office. Will have Blanca tell pt time and date of appt.

## 2012-10-13 NOTE — Telephone Encounter (Signed)
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 Patient speaks no [place].

## 2012-10-25 ENCOUNTER — Encounter: Payer: Self-pay | Admitting: Gynecology

## 2012-11-01 ENCOUNTER — Other Ambulatory Visit: Payer: Self-pay | Admitting: *Deleted

## 2012-11-01 DIAGNOSIS — Z1211 Encounter for screening for malignant neoplasm of colon: Secondary | ICD-10-CM

## 2012-11-03 ENCOUNTER — Other Ambulatory Visit: Payer: Self-pay | Admitting: Gynecology

## 2012-11-03 DIAGNOSIS — Z1211 Encounter for screening for malignant neoplasm of colon: Secondary | ICD-10-CM

## 2012-11-18 ENCOUNTER — Encounter: Payer: Self-pay | Admitting: Gynecology

## 2013-01-01 ENCOUNTER — Other Ambulatory Visit: Payer: Self-pay | Admitting: Gynecology

## 2013-11-14 ENCOUNTER — Encounter: Payer: Self-pay | Admitting: Gastroenterology

## 2013-11-30 ENCOUNTER — Ambulatory Visit (AMBULATORY_SURGERY_CENTER): Payer: Self-pay

## 2013-11-30 VITALS — Ht <= 58 in | Wt 150.0 lb

## 2013-11-30 DIAGNOSIS — Z860100 Personal history of colon polyps, unspecified: Secondary | ICD-10-CM

## 2013-11-30 MED ORDER — MOVIPREP 100 G PO SOLR: 1.0000 | Freq: Once | ORAL | Status: DC

## 2013-12-07 ENCOUNTER — Ambulatory Visit: Payer: Medicare Other

## 2013-12-13 ENCOUNTER — Ambulatory Visit (AMBULATORY_SURGERY_CENTER): Payer: Medicare Other | Admitting: Gastroenterology

## 2013-12-13 ENCOUNTER — Encounter: Payer: Self-pay | Admitting: Gastroenterology

## 2013-12-13 VITALS — BP 118/70 | HR 61 | Temp 99.2°F | Resp 19 | Ht <= 58 in | Wt 150.0 lb

## 2013-12-13 DIAGNOSIS — Z86010 Personal history of colonic polyps: Secondary | ICD-10-CM

## 2013-12-13 DIAGNOSIS — K5730 Diverticulosis of large intestine without perforation or abscess without bleeding: Secondary | ICD-10-CM

## 2013-12-13 DIAGNOSIS — D126 Benign neoplasm of colon, unspecified: Secondary | ICD-10-CM

## 2013-12-13 LAB — GLUCOSE, CAPILLARY
GLUCOSE-CAPILLARY: 288 mg/dL — AB (ref 70–99)
Glucose-Capillary: 195 mg/dL — ABNORMAL HIGH (ref 70–99)

## 2013-12-13 MED ORDER — SODIUM CHLORIDE 0.9 % IV SOLN: 500.0000 mL | INTRAVENOUS | Status: DC

## 2013-12-13 NOTE — Progress Notes (Signed)
Called to room to assist during endoscopic procedure.  Patient ID and intended procedure confirmed with present staff. Received instructions for my participation in the procedure from the performing physician. Notified by MD and G[employer] Tech location of polyps and method of removal. ewm

## 2013-12-13 NOTE — Patient Instructions (Signed)
YOU HAD AN ENDOSCOPIC PROCEDURE TODAY AT THE [facility]: Refer to the procedure report that was given to you for any specific questions about what was found during the examination.  If the procedure report does not answer your questions, please call your gastroenterologist to clarify.  If you requested that your care partner not be given the details of your procedure findings, then the procedure report has been included in a sealed envelope for you to review at your convenience later.  YOU SHOULD EXPECT: Some feelings of bloating in the abdomen. Passage of more gas than usual.  Walking can help get rid of the air that was put into your GI tract during the procedure and reduce the bloating. If you had a lower endoscopy (such as a colonoscopy or flexible sigmoidoscopy) you may notice spotting of blood in your stool or on the toilet paper. If you underwent a bowel prep for your procedure, then you may not have a normal bowel movement for a few days.  DIET: Your first meal following the procedure should be a light meal and then it is ok to progress to your normal diet.  A half-sandwich or bowl of soup is an example of a good first meal.  Heavy or fried foods are harder to digest and may make you feel nauseous or bloated.  Likewise meals heavy in dairy and vegetables can cause extra gas to form and this can also increase the bloating.  Drink plenty of fluids but you should avoid alcoholic beverages for 24 hours.  ACTIVITY: Your care partner should take you home directly after the procedure.  You should plan to take it easy, moving slowly for the rest of the day.  You can resume normal activity the day after the procedure however you should NOT DRIVE or use heavy machinery for 24 hours (because of the sedation medicines used during the test).    SYMPTOMS TO REPORT IMMEDIATELY: A gastroenterologist can be reached at any hour.  During normal business hours, 8:30 AM to 5:00 PM Monday through Friday,  call [phone].  After hours and on weekends, please call the GI answering service at [phone] who will take a message and have the physician on call contact you.   Following lower endoscopy (colonoscopy or flexible sigmoidoscopy):  Excessive amounts of blood in the stool  Significant tenderness or worsening of abdominal pains  Swelling of the abdomen that is new, acute  Fever of 100F or higher   FOLLOW UP: If any biopsies were taken you will be contacted by phone or by letter within the next 1-3 weeks.  Call your gastroenterologist if you have not heard about the biopsies in 3 weeks.  Our staff will call the home number listed on your records the next business day following your procedure to check on you and address any questions or concerns that you may have at that time regarding the information given to you following your procedure. This is a courtesy call and so if there is no answer at the home number and we have not heard from you through the emergency physician on call, we will assume that you have returned to your regular daily activities without incident.  SIGNATURES/CONFIDENTIALITY: You and/or your care partner have signed paperwork which will be entered into your electronic medical record.  These signatures attest to the fact that that the information above on your After Visit Summary has been reviewed and is understood.  Full responsibility of the confidentiality of   this discharge information lies with you and/or your care-partner.   Resume medications. Information given on polyps, diverticulosis and high fiber with discharge instructions. 

## 2013-12-13 NOTE — Op Note (Signed)
[place]  [address] [place], [address]   COLONOSCOPY PROCEDURE REPORT  PATIENT: Mary, Hunt  MR#: [id number] BIRTHDATE: October 31, 1941 , 1  yrs. old GENDER: Female ENDOSCOPIST: Sable Feil, MD, [facility] REFERRED BY: PROCEDURE DATE:  12/13/2013 PROCEDURE:   Colonoscopy with biopsy First Screening Colonoscopy - Avg.  risk and is 50 yrs.  old or older - No.  Prior Negative Screening - Now for repeat screening. N/A  History of Adenoma - Now for follow-up colonoscopy & has been > or = to 3 yrs.  Yes hx of adenoma.  Has been 3 or more years since last colonoscopy.  Polyps Removed Today? Yes. ASA CLASS:   Class III INDICATIONS:Patient's personal history of adenomatous colon polyps.  MEDICATIONS: propofol (Diprivan) 200mg  IV  DESCRIPTION OF PROCEDURE:   After the risks benefits and alternatives of the procedure were thoroughly explained, informed consent was obtained.  A digital rectal exam revealed no abnormalities of the rectum.   The [id number]  endoscope was introduced through the anus and advanced to the cecum, which was identified by both the appendix and ileocecal valve. No adverse events experienced.   The quality of the prep was excellent, using MoviPrep  The instrument was then slowly withdrawn as the colon was fully examined.      COLON FINDINGS: Mild diverticulosis was noted in the descending colon and sigmoid colon.   Two smooth sessile polyps ranging between 3-67mm in size were found in the sigmoid colon.  Multiple biopsies were performed using cold forceps.Tissue sent to pathology.  Retroflexed views revealed no abnormalities. The time to cecum=2 minutes 08 seconds.  Withdrawal time=10 minutes 08 seconds.  The scope was withdrawn and the procedure completed. COMPLICATIONS: There were no complications.  ENDOSCOPIC IMPRESSION: 1.   Mild diverticulosis was noted in the descending colon and sigmoid colon 2.   Two sessile polyps ranging  between 3-17mm in size were found in the sigmoid colon; multiple biopsies were performed using cold forceps ...r/o adenomas.  RECOMMENDATIONS: 1.  Await pathology results 2.  Continue current medications 3.  Repeat Colonoscopy in 5 years.   eSigned:  Sable Feil, MD, [facility] 12/13/2013 9:11 AM   cc: Uvaldo Rising, MD

## 2013-12-14 ENCOUNTER — Telehealth: Payer: Self-pay | Admitting: *Deleted

## 2013-12-14 NOTE — Telephone Encounter (Signed)
  Follow up Call-  Call back number 12/13/2013  Post procedure Call Back phone  # [phone] cell.  Permission to leave phone message Yes     Patient questions:  Message left to call if she needs [place].

## 2013-12-18 ENCOUNTER — Encounter: Payer: Self-pay | Admitting: Gastroenterology

## 2013-12-19 ENCOUNTER — Telehealth: Payer: Self-pay | Admitting: Gastroenterology

## 2013-12-19 ENCOUNTER — Encounter: Payer: Self-pay | Admitting: *Deleted

## 2013-12-19 NOTE — Telephone Encounter (Signed)
Spoke with Mary Hunt in [facility] admission and she will have a note for patient up front at [facility] desk.  Santiago Glad given the [facility] number to see if the letter can be faxed.

## 2014-01-17 ENCOUNTER — Other Ambulatory Visit: Payer: Self-pay | Admitting: Gynecology

## 2014-01-17 DIAGNOSIS — M8580 Other specified disorders of bone density and structure, unspecified site: Secondary | ICD-10-CM

## 2014-02-01 ENCOUNTER — Encounter: Payer: Self-pay | Admitting: Gynecology

## 2014-02-01 ENCOUNTER — Ambulatory Visit (INDEPENDENT_AMBULATORY_CARE_PROVIDER_SITE_OTHER): Payer: Medicare Other | Admitting: Gynecology

## 2014-02-01 VITALS — BP 124/78 | Ht <= 58 in | Wt 150.0 lb

## 2014-02-01 DIAGNOSIS — M949 Disorder of cartilage, unspecified: Secondary | ICD-10-CM

## 2014-02-01 DIAGNOSIS — N952 Postmenopausal atrophic vaginitis: Secondary | ICD-10-CM | POA: Insufficient documentation

## 2014-02-01 DIAGNOSIS — Z780 Asymptomatic menopausal state: Secondary | ICD-10-CM

## 2014-02-01 DIAGNOSIS — L293 Anogenital pruritus, unspecified: Secondary | ICD-10-CM

## 2014-02-01 DIAGNOSIS — Z8639 Personal history of other endocrine, nutritional and metabolic disease: Secondary | ICD-10-CM

## 2014-02-01 DIAGNOSIS — M8580 Other specified disorders of bone density and structure, unspecified site: Secondary | ICD-10-CM

## 2014-02-01 DIAGNOSIS — M899 Disorder of bone, unspecified: Secondary | ICD-10-CM

## 2014-02-01 DIAGNOSIS — N951 Menopausal and female climacteric states: Secondary | ICD-10-CM

## 2014-02-01 DIAGNOSIS — N898 Other specified noninflammatory disorders of vagina: Secondary | ICD-10-CM

## 2014-02-01 LAB — WET PREP FOR TRICH, YEAST, CLUE
TRICH WET PREP: NONE SEEN
Yeast Wet Prep HPF POC: NONE SEEN

## 2014-02-01 MED ORDER — HYDROCORTISONE VALERATE 0.2 % EX OINT: 1.0000 "application " | TOPICAL_OINTMENT | Freq: Two times a day (BID) | CUTANEOUS | Status: DC

## 2014-02-01 MED ORDER — METRONIDAZOLE 500 MG PO TABS: 500.0000 mg | ORAL_TABLET | Freq: Two times a day (BID) | ORAL | Status: DC

## 2014-02-01 NOTE — Patient Instructions (Addendum)
Vacuna contra la culebrilla, Lo que debe saber (Shingles Vaccine, What You Need to Know) QU ES LA CULEBRILLA?  Es una erupcin dolorosa de la piel, en la que aparecen ampollas. Esta enfermedad tambin se denomina herpes zoster o zoster.  Se trata de una erupcin que aparece en un lado del rostro o del cuerpo y dura entre 2 y 4 semanas. El sntoma principal es el dolor, que puede ser bastante intenso. Otros sntomas son fiebre, cefalea, escalofros y malestar en el estmago. En casos raros, esta infeccin puede causar neumona, problemas auditivos, ceguera, inflamacin cerebral (encefalitis) o la muerte.  En 1 de cada 5 personas, el dolor intenso puede persistir an despus que la erupcin desaparezca. Este dolor se denomina neurlagia post herptica.  La causa de la culebrilla es el virus varicela-zoster. Este es el mismo virus que causa la varicela. Slo las personas que han sufrido varicela o que han recibido la vacuna contra la varicela pueden tener culebrilla. El virus permanece en el organismo. Puede volver a aparecer muchos aos despus para causar culebrilla.  La culebrilla no se contagia. Sin embargo, una persona que nunca sufri varicela (ni recibi la vacuna) podra contraer varicela contagindose de alguien que padece culebrilla. Esto no es frecuente.  La culebrilla es ms frecuente entre las personas mayores de 50 aos que entre los ms jvenes. Tambin es ms frecuente en personas cuyo sistema inmunolgico est debilitado debido a una enfermedad como el cncer o medicamentos como los corticoides o las drogas utilizadas en quimioterapia.  Al menos 1 milln de personas contrae culebrilla cada ao en los [place]. VACUNA CONTRA LA CULEBRILLA  Esta vacuna fue patentada en 2006. En los ensayos clnicos, se demostr que la vacuna prevena la culebrilla en alrededor de la mitad de las personas. Tambin reduce el dolor asociado a este trastorno.  Se indica una nica dosis de vacuna  en adultos de ms de 60 aos. ALGUNAS PERSONAS NO DEBEN RECIBIR LA VACUNA O DEBEN ESPERAR No deben vacunarse las personas que:  Alguna vez hayan sufrido una reaccin alrgica a la gelatina, al antibitico neomicina o a cualquier otro componente de la vacuna. Si observa una reaccin alrgica grave, comunquese con el mdico.  Su sistema inmunolgico est debilitado debido a:  SIDA u otra enfermedad que afecte el sistema inmunolgico.  Tratamientos con drogas que afecten el sistema inmunolgico como los corticoides.  Tratamientos para el cncer como la radiacin o la quimioterapia.  Tienen una historia de cncer que haya afectado la mdula sea o el sistema linftico, como leucemia o linfoma.  Estn embarazadas o desean estarlo. Las que deseen quedar embarazadas no deben hacerlo hasta al menos 4 semanas luego de recibir esta vacuna. Las personas con enfermedades menores como un resfro, pueden vacunarse. Las personas que sufren enfermedades moderadas o graves deben esperar hasta su recuperacin antes de recibir la vacuna. Aqu se incluye a quienes presenten una temperatura de 101.3 F (38.5 C). CULES SON LOS RIESGOS DE LA VACUNA CONTRA LA CULEBRILLA?  Una vacuna, como cualquier medicamento, puede causar problemas serios, como por ejemplo reacciones alrgicas graves. Sin embargo, el riesgo de que cualquier vacuna cause un dao grave, o la muerte, es extremadamente pequeo.  No se han asociado reacciones de importancia a estas vacunas. Problemas leves  Enrojecimiento, dolor, hinchazn, o picazn en el lugar de la inyeccin (en 1 de cada 3 personas).  Cefalea (en aproximadamente 1 de cada 70 personas). Como todas las vacunas, esta vacuna sigue siendo controlada para hallar problemas   infrecuentes o graves. QU DEBO HACER EN CASO DE UNA REACCIN MODERADA O GRAVE? Qu debo observar? Cualquier estado anormal como una reaccin alrgica grave o fiebre alta. Si ocurre una reaccin alrgica  grave, ocurrir Camera operator unos pocos minutos hasta una hora despus de la aplicacin de la inyeccin. Signos de reaccin alrgica grave que presente dificultad para respirar, debilidad, ronquera o silbidos, ritmo cardaco acelerado, ronchas, mareos, palidez, o hinchazn de la garganta.  Qu debo hacer?  Comunquese con el profesional que lo asiste o lleve inmediatamente a la persona al mdico.  Cuntele al mdico lo que ha sucedido, la fecha y momento en el que ocurri y cundo se aplic la vacuna.  Pdale a su mdico, enfermera o el departamento de salud que informen la reaccin llenando el formulario de Vaccine Adverse Event Report (Informe de reacciones adversas a las vacunas - VAERS). O, puede enviar este informe a travs de la pgina web del VAERS al [email or web address] o llamando al [id number]. El VAERS no proporciona orientacin mdica. CMO PUEDO SABER MS?  El profesional podr darle el prospecto de la vacuna o sugerirle otras fuentes de informacin.  Comunquese con los Doctor, hospital for [employer] and Prevention (Centros para el control y la prevencin de enfermedades, CDC).  Llame al [phone] (1-800-CDC-INFO).  Visite los sitios web de Public [employer] [email or web address]. CDC Shingles Vaccine-Spanish VIS (08/14/08) Document Released: 04/13/2008 Document Revised: 03/11/2013Citica con rehabilitacin (Sciatica with Rehab) El nervio citico va desde la regin inferior de la espalda hacia la pierna y es el responsable de la sensibilidad y el control de los msculos de la parte de atrs (posterior) del muslo, pierna y pie. La citica es una enfermedad caracterizada por una inflamacin en este nervio.  SNTOMAS Signos de dao al nervio, incluso adormecimiento o debilidad en el lado posterior de las extremidades bajas. Dolor en la parte posterior del muslo que podra bajar hacia la pierna. Dolor que [place] al estar sentado durante largos perodos de [place]. Algunas  veces, sensibilidad en las nalgas. CAUSAS La causa de la citica es la inflamacin de los nervios citicos. La inflamacin se debe a que algo irrita el nervio. Entre las causas de la irritacin se encuentran: Estar sentado durante largos perodos. Traumatismos directos al nervio. Artritis de Tax adviser. Hernia o ruptura de disco. Deslizamiento de Roselee [place] (espondilolistesis). Presin de los tejidos blandos, como msculos o el tejidos tipo ligamento (fascia). LOS RIESGOS AUMENTAN CON: Deportes en los que se presiona la columna (ftbol americano o levantamiento de pesas). Poca fuerza y flexibilidad. No hacer un precalentamiento adecuado. Historia familiar de dolor de cintura o trastornos en discos. Lesiones o cirugas previas en la espalda. Mecnica incorrecta del cuerpo, en especial al levantar, o mala postura. PREVENCIN Precalentamiento adecuado y elongacin antes de la [place]. Mantener la forma fsica: Kerry Hough, flexibilidad y resistencia muscular. Capacidad cardiovascular. Conozca y use tcnicas adecuadas, especialmente en posturas y levantamiento. Cuando sea posible, tener un entrenador que corrija la Programmer, systems. Evite las actividades que tensionen constantemente la columna. PRONSTICO Si se trata adecuadamente, generalmente es curable dentro de las 6 semanas. En ocasiones requiere someterse a [place].  [place] que incluye dolor, adormecimiento, hormigueo o debilidad. Dolor crnico en la espalda. Riesgos de la ciruga: infecciones, hemorragias, dao en los nervios, o daos a los tejidos circundantes. TRATAMIENTO El tratamiento inicial incluye interrumpir las actividades que agravan los sntomas. Se incluye el uso de medicamentos y la aplicacin de hielo para reducir Conservation officer, historic buildings y la inflamacin. [place]  de elongacin y fortalecimiento pueden ayudar a reducir Conservation officer, historic buildings con la [place]. Los ejercicios pueden Sports coach o con un terapeuta. Un terapeuta podr recomendarle otros tratamientos, como estimulacin nerviosa electrnica transcutnea (TENS) o ultrasonido. En algunos casos se indica una inyeccin de corticoides para reducir la inflamacin del nervio citico. Si los sntomas persisten por ms de 6 meses de tratamiento no quirrgico (conservador), se Biochemist, clinical. MEDICAMENTOS  Si necesita analgsicos, se recomiendan los antiinflamatorios no esteroides, como aspirina e ibuprofeno y otros calmantes menores, como acetaminofeno. No tome medicamentos para el dolor dentro de los 7 das previos a la [place]. Los analgsicos prescriptos se indicarn si el mdico lo considera necesario. Utilcelos como se le indique y slo cuando lo necesite. Los ungentos pueden ser beneficiosos. En algunos casos se indica una inyeccin de corticosteroides. Estas inyecciones deben reservarse para los casos graves, porque slo se pueden administrar una determinada cantidad de veces. CALOR Y FRO  El tratamiento con fro [employer] y reduce la inflamacin. El fro debe aplicarse durante 10 a 15 minutos cada 2  3 horas para reducir la inflamacin y Conservation officer, historic buildings e inmediatamente despus de cualquier actividad que agrava los sntomas. Utilice bolsas de hielo o masajee la zona con un trozo de hielo (masaje de hielo). El calor puede usarse antes de Neurosurgeon y [place] fortalecimiento indicadas por el profesional, le fisioterapeuta o Industrial/product designer. Utilice una bolsa trmica o sumerja la lesin en agua caliente. SOLICITE ATENCIN MDICA SI: El tratamiento parece no ofrecer beneficios, o el trastorno [place]. Los medicamentos producen efectos secundarios. [place] personas con dolor de citica encuentran que sus sntomas empeoran con el delantero excesiva flexin (flexin) o arco en la espalda baja (extensin). Los ejercicios que le ayudarn  a Investment banker, operational sus sntomas se Furniture conservator/restorer. El mdico, fisioterapeuta o Paediatric nurse a Teacher, adult education qu ejercicios sern de ayuda para resolver su dolor de espalda. No realice ningn ejercicio sin consultarlo antes con el profesional. Discontinue los ejercicios que empeoran sus sntomas, hasta que hable con el mdico. Si siente dolor, entumecimiento u hormigueo que [employer] glteos, piernas o pies, el objetivo de esta terapia es que estos sntomas se acerquen a la espalda y Occupational hygienist. En ocasiones, los sntomas de la pierna mejorarn, Psychologist, sport and exercise en la espalda puede empeorar; esto es un indicio tpico del progreso en la rehabilitacin. Asegrese de que estar atento a cualquier cambio en sus sntomas y las actividades que ha [employer] 24 horas antes del cambio. Compartir esta informacin con su mdico le permitir un mejor tratamiento para tratar su enfermedad. Estos ejercicios le ayudarn en la recuperacin de la lesin. Los sntomas podrn aliviarse con o sin asistencia adicional de su mdico, fisioterapeuta o Administrator, sports. Al completar estos ejercicios, recuerde:  Restaurar la flexibilidad del tejido ayuda a que las articulaciones recuperen el movimiento normal. Esto permite que el movimiento y la actividad sea ms saludables y menos dolorosos. Para que sea efectiva, cada elongacin debe realizarse durante al menos 30 segundos. La elongacin nunca debe ser dolorosa. Deber sentir slo un alargamiento o distensin suave del tejido que estira. EJERCICIOS DE AMPLITUD DE MOVIMIENTOS Y ELONGACIN: ELONGACION Flexin - una rodilla al pecho Recustese en una cama dura o sobre el piso, con ambas piernas extendidas al frente. Manteniendo una pierna en contacto con el piso, lleve la rodilla opuesta al pecho.  Mantenga la pierna en esa posicin, sostenindola por la zona posterior del muslo o por la rodilla. Presione hasta sentir un suave estiramiento en la  cintura. Mantenga esta posicin durante __________ segundos. Libere la pierna lentamente y repita el ejercicio con el lado opuesto. Reptalo __________ veces. Realice este estiramiento __________ Vicenta Aly por da.  ELONGACIN Flexin, dos rodillas al pecho  Recustese en una cama dura o sobre el piso, con ambas piernas extendidas al frente. Manteniendo una pierna en contacto con el piso, lleve la rodilla opuesta al pecho. Tense los msculos del estmago para apoyar la espalda y levante la otra rodilla [place]. Mantenga las piernas en su lugar y tmese por detrs [place]. Con ambas rodillas en el pecho, tire hasta que sienta un estiramiento en la parte trasera de la espalda. Mantenga esta posicin durante __________ segundos. Tense los msculos del estmago y baje las piernas de a una por vez. Reptalo __________ veces. Realice este estiramiento __________ Vicenta Aly por da.  ELONGACIN Rotacin de la zona baja del tronco. Recustese sobre una cama firme o sobre el suelo. [place], doble las rodillas de modo que ambas apunten hacia el techo y los pies queden bien apoyados en el piso. Extienda los brazos a Teaching laboratory technician. Esto estabilizar la zona superior del cuerpo, manteniendo los hombros en contacto con el piso. Con cuidado y lentamente deje caer ambas rodillas juntas hacia un lado, hasta que sienta un suave estiramiento en la espalda baja. Mantenga esta posicin durante __________ segundos. Tense los msculos del estmago para apoyar la espalda y lleve las rodillas a la posicin inicial. Repita el ejercicio hacia el otro lado. Reptalo __________ veces. Realice este ejercicio __________ veces por da. EJERCICIOS DE AMPLITUD DE MOVIMIENTOS Y FLEXIBILIDAD: ELONGACIN Extensin posicin prona sobre los codos Acustese sobre el estmago sobre el piso, una cama ser muy blanda. Coloque las palmas a una distancia igual al ancho de los hombres y a la altura de la  cabeza. Coloque los codos bajo los hombros. Si siente dolor, colquese almohadas debajo del pecho. Deje que su cuerpo se relaje, de modo que las caderas queden ms abajo y tengan ms contacto con el piso. Mantenga esta posicin durante __________ segundos. Vuelva lentamente a la posicin plana sobre el piso. Reptalo __________ veces. Realice este estiramiento __________ Vicenta Aly por da.  [place] de brazos en posicin prona Acustese sobre el [employer] piso, una cama ser [place]. Coloque las palmas a una distancia igual al ancho de los hombres y a la altura de la cabeza. Mantenga la espalda tan relajada como pueda, enderece lentamente los codos mientras mantiene las caderas contra el suelo. Puede modificar la posicin de las manos para estar ms cmodo. A medida que gana movimiento, sus manos quedarn ms por debajo de los hombros. Mantenga cada posicin durante __________ segundos. Vuelva lentamente a la posicin plana sobre el piso. Reptalo __________ veces. Realice este estiramiento __________ Vicenta Aly por da.  EJERCICIOS DE FORTALECIMIENTO - Citica Estos ejercicios le ayudarn en la recuperacin de la lesin. Estos ejercicios deben hacerse cerca de su "punto dulce". Este es el arco neutro, de la parte baja de la espalda, en algn lugar entre la posicin completamente redondeada y arqueada plenamente, que es la posicin menos dolorosa. Cuando se realiza en [employer] de seguridad del movimiento, estos ejercicios se pueden Risk manager para las personas que tienen una lesin basada en flexin o extensin. Con  estos ejercicios, los sntomas podrn desaparecer con o sin mayor intervencin del profesional, el fisioterapeuta o Industrial/product designer. Al completar estos ejercicios, recuerde:  Los msculos pueden ganar tanto la resistencia como la fuerza que necesita para sus actividades diarias a travs de ejercicios controlados. Realice los ejercicios como se lo indic  el mdico, el fisioterapeuta o Industrial/product designer. [place] slo con los ejercicios de resistencia y haga las repeticiones que su mdico le indique. Podr experimentar dolor o cansancio muscular, pero el dolor o molestia que trata de eliminar a travs de los ejercicios nunca debe empeorar. Si el dolor empeora, detngase y asegrese de que est siguiendo las directivas correctamente. Si an siente dolor luego de Optometrist lo ajustes necesarios, deber discontinuar el ejercicio hasta que pueda conversar con el profesional sobre el problema. FORTALECIMIENTO - Abdominales profundos - Inclinacin plvica Recustese sobre una cama firme o sobre el suelo. [place], doble las rodillas de modo que ambas apunten hacia el techo y los pies queden bien apoyados en el piso. Tensione la zona baja de los msculos abdominales para presionar la Materials engineer. Este movimiento har rotar su pelvis de modo que el cccix quede hacia arriba y no apuntando a los pies o hacia el piso. Con una tensin suave y respiracin pareja, mantenga esta posicin durante __________ segundos. Reptalo __________ veces. Realice este estiramiento __________ Vicenta Aly por da.  FORTALECIMIENTO - Abdominales encogimiento abdominal Recustese sobre una cama firme o sobre el suelo. [place], doble las rodillas de modo que ambas apunten hacia el techo y los pies queden bien apoyados en el piso. [place]. Apunte suavemente con la barbilla hacia abajo, sin doblar el cuello. Tensione los abdominales y eleve lentamente el tronco la altura suficiente para despegar los omplatos. Si se eleva ms, pondr tensin excesiva en la cintura y esto no fortalecer ms los abdominales. Controle la vuelta a la posicin inicial. Reptalo __________ veces. Realice este estiramiento __________ Vicenta Aly por da.  FORTALECIMIENTO - En cuadrpedo, elevacin de miembro superior e inferior opuestos [employer] y las rodillas en una superficie firme. Las manos deben quedar a la altura de los hombros y las rodillas [place]. Puede colocar algo debajo las rodillas para estar ms cmodo. Encuentre la posicin neutral de la columna vertebral y Heritage manager los msculos abdominales de modo que pueda mantener esta posicin. Los hombros y las caderas deben formar un rectngulo paralelo con el suelo y recto. Manteniendo el tronco firme, eleve la mano derecha a la altura del hombro y luego eleve la pierna izquierda a la altura de la cadera. Asegrese de no contener la respiracin. Mantenga cada posicin durante __________ segundos. Con los msculos abdominales en tensin y la espalda firme, vuelva lentamente a la posicin inicial. Repita con el otro brazo y la otra pierna. Reptalo __________ veces. Realice este estiramiento __________ Vicenta Aly por da.  FUERZA Abdominales y cudriceps - Lexicographer las piernas rectas Recustese en una cama dura o sobre el piso, con ambas piernas extendidas al frente. Deje una pierna en contacto con el suelo y doble la otra rodilla de manera que el pie quede contra el suelo. Encuentre la posicin neutral de la columna vertebral y Heritage manager los msculos abdominales de modo que pueda mantener esta posicin. [place] lentamente la pierna del suelo una 6 pulgadas y cuente [place] 8, asegrese de no contener la respiracin. Mantega la columna en posicin neutral, y baje lentamente  la pierna hasta el suelo. Repita el ejercicio con cada pierna __________ veces. Realice este estiramiento __________ Vicenta Aly por da. CONSIDERACIONES ACERCA DE LA POSTURA Y LA MECNICA DEL CUERPO Citica Si mantiene una postura correcta cuando se encuentre de pie, sentado o realizando sus actividades, reducir el [employer] tejidos del cuerpo, y Advertising account executive a los tejidos lesionados la posibilidad de curarse y Engineering geologist las experiencias dolorosas. A continuacin se indican  pautas generales para mejorar la postura- Su mdico o fisioterapeuta le dar instrucciones especficas segn sus necesidades. Al leer estas pautas recuerde: Los ejercicios indicados por su mdico lo ayudarn a Scientist, product/process development flexibilidad y la fuerza para Theatre manager las posturas correctas. Una postura correcta le proporciona a sus articulaciones el medio ptimo para funcionar bien. Las articulaciones se desgastan menos cuando estn sostenidas adecuadamente por una columna vertebral en buena postura. Esto significa que su cuerpo estar ms sano y Network engineer. La correcta postura debe practicarse en todas las actividades, especialmente al estar sentado o de pie durante [place]. Tambin es importante al realizar actividades repetitivas de bajo estrs (tipeo) o una actividad nica y pesada. POSICIONES DE Cathe Mons Tenga en cuenta cules son las posturas que ms dolor le causan al elegir una posicin de descanso. Si siente dolor con las actividades en que deba realizar una flexin (sentarse, inclinarse, detenerse, ponerse en cuclillas), elija una posicin que le permita descansar en una postura menos flexionada. Evite curvarse en posicin fetal cuando se encuentre de lado. Si el dolor empeora con las actividades basadas en la extensin (estar de pie durante un tiempo prolongado, trabajar con las manos por arriba de la cabeza) evite descansar en Ardelia Mems posicin extendida durante mucho tiempo, como dormir sobre el [place]. La [employer] de las Artist cmodo el descanso sobre la columna vertebral en una posicin neutral, ni muy redondeada ni [place]. Recustese sobre su lado en una cama que no est hundida con una almohada entre las rodillas o sobre la espalda con una almohada bajo las rodillas, y sentir [place]. Tenga en cuenta que cualquier posicin en [employer], no importa si es una postura [place], puede provocarle rigidez. POSTURAS CORRECTAS PARA SENTARSE Con el fin de minimizar el estrs y Astronomer en su columna, deber sentarse con la postura correcta. Esto le ayudar a que el cuerpo est ms sano. Recuperar una buena postura es un proceso gradual. Muchas personas pueden trabajar ms cmodas mediante el uso de diferentes soportes hasta que tengan la flexibilidad y la fuerza para mantener esta postura por su cuenta. Al sentarse con la [employer], los odos deben estar sobre los hombros y los hombros [place]. Debe utilizar el respaldo de la silla para apoyar la espalda. La espalda estar en una posicin neutral, ligeramente arqueada. Puede colocar una pequea almohada o toalla doblada en la base de la espalda baja para apoyarse.  Si trabaja en un escritorio, cree un ambiente que le proporciones un buen soporte y [place]. Sin soporte extra, los msculos se fatigan y causan tensin excesiva en las articulaciones y otros tejidos. Tenga en cuenta estas recomendaciones: SILLA:  La silla debe poder deslizarse por debajo del escritorio cuando su espalda tome contacto con el respaldo. Esto le permitir trabajar ms cerca. La altura de la silla debe permitirle que los ojos tengan el nivel de la parte superior del monitor y las manos estn ms abajo que los codos. POSICIN DEL CUERPO Los pies deben tener contacto con el piso. Si no  es posible, use un posapies. Mantenga las [employer] hombros. Esto reducir el estrs en el cuello y en la cintura. POSTURAS INCORRECTAS PARA SENTARSE Si se siente cansado e incapaz de asumir una postura sentada sana, no se encorve ni se hunda. Esto pone una tensin excesiva en los tejidos de su espalda, y causa ms dao y Social research officer, government.[place] opciones ms saludables se incluyen: El uso de ms apoyo, como una almohada lumbar. Cambio de tareas, a algo que demande una posicin vertical o caminar. Tomar una breve caminata. Recostarse y Physicist, medical posicin neutral. DE PIE DURANTE UN TIEMPO PROLONGADO E INCLINADO LIGERAMENTE HACIA  ADELANTE Cuando deba realizar una tarea que requiera inclinacin hacia adelante estando de pie en el mismo sitio durante mucho tiempo, coloque un pie en un objeto de 2 a 4 pulgadas de alto, para [employer]. Cuando ambos pies estn en el piso, la zona inferior de la espalda tiene a perder su ligera curvatura hacia adentro. Si esta curva se aplana (o se pronuncia demasiado) la espalda y las articulaciones experimentarn demasiado estrs, se fatigarn ms rpidamente y Therapist, sports.  POSTURAS CORRECTAS PARA ESTAR DE PIE Una postura adecuada de pie realizarse en todas las actividades diarias, incluso si slo toman un momento, como al [employer]. Como en la postura de sentado, los odos deben estar sobre los hombros y los hombros [place]. Deber mantener una ligera tensin en sus msculos abdominales para asegurar la columna vertebral. El cccix debe apuntar hacia el suelo, no detrs de su cuerpo, por que resulta en una curvatura de la espalda sobre-extendida.  [place] posturas incorrectas para estar de pie incluyen tener la cabeza hacia delante, las rodillas bloqueadas o una excesiva curvatura de la espalda. CAMINAR [place] en [place] erguida. Las [place], hombros y caderas deben estar alineados. ACTIVIDAD PROLONGADA EN UNA POSICIN FLEXIONADA Al completar una tarea que requiere que se doble la cintura hacia adelante o inclinarse sobre una superficie baja, trate de encontrar una manera de estabilizar 3 de cada 4 de sus miembros. Puede colocar una mano o el codo en el [place], o descansar una rodilla en la superficie en la que est apoyado. Esto le proporcionar ms estabilidad para que sus msculos no se cansen tan rpidamente. El [employer] rodillas [place], o ligeramente dobladas, tambin reducir el estrs en la espalda baja. TCNICAS CORRECTAS PARA LEVANTAR OBJETOS SI:  Asumir una postura amplia. Esto le proporcionar ms  estabilidad y la oportunidad de acercarse lo ms posible al objeto que se est levantando. Tense los abdominales para asegurar la columna vertebral; luego flexione rodillas y caderas. Manteniendo la espalda en una posicin neutral, haga el esfuerzo con los msculos de la pierna. Levntese con las piernas, manteniendo la espalda derecha. Pruebe el peso de los objetos desconocidos antes de tratar de Special educational needs teacher. Trate de [employer] codos hacia abajo y a los lados, con el fin de obtener la fuerza de los hombros al llevar un objeto. Siempre pida ayuda a otra persona cuando deba levantar objetos pesados o incmodos TCNICAS INCORRECTAS PARA LEVANTAR OBJETOS NO:  Bloquee rodillas al levantar, aunque sea un objeto pequeo. Se doble ni gire. Gire sobre los pies ni los mueva cuando necesite cambiar de direccin. Tome conciencia que no puede levantar con seguridad ni un clip de papel, sin Chiropodist. Document Released: 10/14/2009 Document Revised: 01/18/2012 [facility] Patient Information 2014 [place], [place].  ExitCare Patient Information 2014 [place]. Citica  (  Sciatica)  La citica es el dolor, debilidad, entumecimiento u hormigueo a lo largo del nervio citico. El nervio comienza en la zona inferior de la espalda y desciende por la parte posterior de cada pierna. El nervio controla los msculos de la parte inferior de la pierna y de la zona posterior de la rodilla, y transmite la sensibilidad a la parte posterior del muslo, la pierna y la planta del pie. La citica es un sntoma de otras afecciones mdicas. Por ejemplo, un dao a los nervios o algunas enfermedades como un disco herniado o un espoln seo en la columna vertebral, podran daarle o presionar en el nervio citico. Esto causa dolor, debilidad y otras sensaciones normalmente asociadas con la citica. Generalmente la citica afecta slo un lado del cuerpo. CAUSAS   Disco herniado o desplazado.  Enfermedad degenerativa del  disco.  Un sndrome doloroso que compromete un msculo angosto de los glteos (sndrome piriforme).  Lesin o fractura plvica.  Embarazo.  Tumor (casos raros). SNTOMAS  Los sntomas pueden variar de leves a muy graves. Por lo general, los sntomas descienden desde la zona lumbar a las nalgas y la parte posterior de la pierna. Ellos son:   Hormigueo leve o dolor sordo en la parte inferior de la espalda, la pierna o la cadera.  Adormecimiento en la parte posterior de la pantorrilla o la planta del pie.  Sensacin de [employer] zona lumbar, la pierna o la cadera.  Dolor agudo en la zona inferior de la espalda, la pierna o la cadera.  Debilidad en las piernas.  Dolor de espalda intenso que [employer] movimientos. Los sntomas pueden empeorar al toser, Brewing technologist, rer o estar sentado o parado durante [employer]. Adems, el sobrepeso puede empeorar los sntomas.  DIAGNSTICO  Su mdico le har un examen fsico para buscar los sntomas comunes de la citica. Le pedir que haga algunos movimientos o actividades que activaran el dolor del nervio citico. Para encontrar las causas de la citica podr indicarle otros estudios. Estos pueden ser:   Anlisis de [place].  Radiografas.  Pruebas de diagnstico por imgenes, como resonancia magntica o tomografa computada. TRATAMIENTO  El tratamiento se dirige a las causas de la citica. A veces, el tratamiento no es necesario, y Conservation officer, historic buildings y Health and safety inspector desaparecen por s mismos. Si necesita tratamiento, su mdico puede sugerir:   Medicamentos de venta libre para Best boy.  Medicamentos recetados, como antiinflamatorios, relajantes musculares o narcticos.  Aplicacin de calor o hielo en la zona del dolor.  Inyecciones de corticoides para disminuir el dolor, la irritacin y la inflamacin alrededor del nervio.  Reduccin de la [employer] perodos de [place].  Ejercicios y estiramiento del abdomen para fortalecer y  Teacher, English as a foreign language la flexibilidad de la columna vertebral. Su mdico puede sugerirle perder peso si el peso extra empeora el dolor de espalda.  Fisioterapia.  La ciruga para eliminar lo que presiona o pincha el nervio, como un espoln seo o parte de una hernia de disco. INSTRUCCIONES PARA EL CUIDADO EN EL HOGAR   Slo tome medicamentos de venta libre o recetados para Glass blower/designer o Health and safety inspector, segn las indicaciones de su mdico.  Aplique hielo sobre el rea dolorida durante 20 minutos 3-4 veces por da durante los primeras 48-72 horas. Luego intente aplicar calor de la misma manera.  Haga ejercicios, elongue o realice sus actividades habituales, si no le causan ms dolor.  Cumpla con todas las sesiones de fisioterapia, segn le indique  su mdico.  Cumpla con todas las visitas de control, segn le indique su mdico.  No use tacones altos o zapatos que no tengan buen apoyo.  Verifique que el colchn no sea muy blando. Un colchn firme Best boy y las Philip. SOLICITE ATENCIN MDICA DE INMEDIATO SI:   Pierde el control de la vejiga o del intestino (incontinencia).  Aumenta la debilidad en la zona inferior de la espalda, la pelvis, las nalgas o las piernas.  Siente irritacin o inflamacin en la espalda.  Tiene sensacin de ardor al [employer].  El dolor empeora cuando se acuesta o lo despierta por la noche.  El dolor es peor del que experiment en el pasado.  Dura ms de 4 semanas.  Pierde peso sin motivo de [place] sbita. ASEGRESE DE QUE:   Comprende estas instrucciones.  Controlar su enfermedad.  Solicitar ayuda de inmediato si no mejora o si empeora. Document Released: 10/26/2005 Document Revised: 04/26/2012 [facility] Patient Information 2014 [place], [place].

## 2014-02-01 NOTE — Progress Notes (Signed)
Mary Hunt 1941/04/22 [id number]   History:    73 y.o.  complaining of vulvar irritation and and pruritus. Patient has not been seen in the office since 2013. Patient had previously been prescribed Westcort cream for right arm the senile keratosis irritation and wanted a prescription refill. Patient has had past history of vitamin D deficiency. She takes 1 Caltrate tablet daily.Her bone density study was done here in our office on June 28 was compared with the previous study of February 2010 her lowest T score in the AP spine was -2.2 and she had significant decrease on her bone mineral densities on the left and right hip respectively of greater than 5%. Because of her age she was started on Fosamax 70 mg q. weekly but only took it for one month.  Her primary physician Dr. Pearline Cables did all her lab work 6 months ago and he is following her for hypertension and type 2 diabetes.   Patient with history of colon polyps in 2009 and this year had colonoscopy and more benign polyps were removed. She is scheduled for her mammogram later today. Patient has not had her shingles vaccine yet.  Past medical history,surgical history, family history and social history were all reviewed and documented in the EPIC chart.  Gynecologic History Patient's last menstrual period was 06/09/1991. Contraception: post menopausal status Last Pap: 2012. Results were: normal Last mammogram: 2014. Results were: Normal but dense  Obstetric History OB History  Gravida Para Term Preterm AB SAB TAB Ectopic Multiple Living  3 3 3       3     # Outcome Date GA Lbr Len/2nd Weight Sex Delivery Anes PTL Lv  3 TRM     M SVD  N Y  2 TRM     M SVD  N Y  1 TRM     F SVD  N Y       ROS: A ROS was performed and pertinent positives and negatives are included in the history.  GENERAL: No fevers or chills. HEENT: No change in vision, no earache, sore throat or sinus congestion. NECK: No pain or stiffness. CARDIOVASCULAR: No chest pain  or pressure. No palpitations. PULMONARY: No shortness of breath, cough or wheeze. GASTROINTESTINAL: No abdominal pain, nausea, vomiting or diarrhea, melena or bright red blood per rectum. GENITOURINARY: No urinary frequency, urgency, hesitancy or dysuria. MUSCULOSKELETAL: No joint or muscle pain, no back pain, no recent trauma. DERMATOLOGIC: No rash, no itching, no lesions. ENDOCRINE: No polyuria, polydipsia, no heat or cold intolerance. No recent change in weight. HEMATOLOGICAL: No anemia or easy bruising or bleeding. NEUROLOGIC: No headache, seizures, numbness, tingling or weakness. PSYCHIATRIC: No depression, no loss of interest in normal activity or change in sleep pattern.     Exam: chaperone present  BP 124/78  Ht 4' 8.5" (1.435 m)  Wt 150 lb (68.04 kg)  BMI 33.04 kg/m2  LMP 06/09/1991  Body mass index is 33.04 kg/(m^2).  General appearance : Well developed well nourished female. No acute distress HEENT: Neck supple, trachea midline, no carotid bruits, no thyroidmegaly Lungs: Clear to auscultation, no rhonchi or wheezes, or rib retractions  Heart: Regular rate and rhythm, no murmurs or gallops Breast:Examined in sitting and supine position were symmetrical in appearance, no palpable masses or tenderness,  no skin retraction, no nipple inversion, no nipple discharge, no skin discoloration, no axillary or supraclavicular lymphadenopathy Abdomen: no palpable masses or tenderness, no rebound or guarding Extremities: no edema or skin discoloration  or tenderness  Pelvic:  Bartholin, Urethra, Skene Glands: Within normal limits             Vagina: No gross lesions or discharge, atrophic changes  Cervix: No gross lesions or discharge  Uterus  anteverted, normal size, shape and consistency, non-tender and mobile  Adnexa  Without masses or tenderness  Anus and perineum  normal   Rectovaginal  normal sphincter tone without palpated masses or tenderness             Hemoccult had colonoscopy  this year   Wet prep: Few clue cells, few WBC, many bacteria, pos amine  Assessment/Plan:  73 y.o. female with normal GYN exam. Patient will be represcribed Westcort cream 0.2% applied twice a day to 3 times a day in her right forearm keratosis. I have recommended that she follow up with a dermatologist. She is scheduled for bone density study next few weeks. Her mammogram is today and have recommended that she request a 3-D because review of her last mammogram indicated that her breasts were dense. Pap smear was not done today in accordance to the new guidelines. She was given a requisition to scheduled to have her shingles vaccine. Her wet prep today clinically indicates evidence of bacterial vaginosis and she will be prescribed Flagyl 500 mg to take one by mouth twice a day for 7 days.  Note: This dictation was prepared with  Dragon/digital dictation along withSmart phrase technology. Any transcriptional errors that result from this process are unintentional.   Terrance Mass MD, 12:26 PM 02/01/2014

## 2014-02-02 ENCOUNTER — Encounter: Payer: Self-pay | Admitting: Gynecology

## 2014-02-02 LAB — VITAMIN D 25 HYDROXY (VIT D DEFICIENCY, FRACTURES): Vit D, 25-Hydroxy: 55 ng/mL (ref 30–89)

## 2014-02-08 ENCOUNTER — Ambulatory Visit (INDEPENDENT_AMBULATORY_CARE_PROVIDER_SITE_OTHER): Payer: Medicare Other

## 2014-02-08 DIAGNOSIS — M899 Disorder of bone, unspecified: Secondary | ICD-10-CM

## 2014-02-08 DIAGNOSIS — M8580 Other specified disorders of bone density and structure, unspecified site: Secondary | ICD-10-CM

## 2014-02-08 DIAGNOSIS — M949 Disorder of cartilage, unspecified: Secondary | ICD-10-CM

## 2014-09-10 ENCOUNTER — Encounter: Payer: Self-pay | Admitting: Gynecology

## 2015-06-13 ENCOUNTER — Encounter: Payer: Self-pay | Admitting: Gastroenterology

## 2016-12-10 DIAGNOSIS — M810 Age-related osteoporosis without current pathological fracture: Secondary | ICD-10-CM

## 2016-12-10 HISTORY — DX: Age-related osteoporosis without current pathological fracture: M81.0

## 2016-12-31 ENCOUNTER — Ambulatory Visit (INDEPENDENT_AMBULATORY_CARE_PROVIDER_SITE_OTHER): Payer: Medicare Other | Admitting: Gynecology

## 2016-12-31 ENCOUNTER — Ambulatory Visit (INDEPENDENT_AMBULATORY_CARE_PROVIDER_SITE_OTHER): Payer: Medicare Other | Admitting: Podiatry

## 2016-12-31 ENCOUNTER — Encounter: Payer: Self-pay | Admitting: Podiatry

## 2016-12-31 ENCOUNTER — Encounter: Payer: Self-pay | Admitting: Gynecology

## 2016-12-31 VITALS — BP 108/58 | HR 76 | Ht <= 58 in | Wt 143.0 lb

## 2016-12-31 VITALS — BP 118/80 | Ht <= 58 in | Wt 143.0 lb

## 2016-12-31 DIAGNOSIS — E559 Vitamin D deficiency, unspecified: Secondary | ICD-10-CM | POA: Insufficient documentation

## 2016-12-31 DIAGNOSIS — M79674 Pain in right toe(s): Secondary | ICD-10-CM | POA: Diagnosis not present

## 2016-12-31 DIAGNOSIS — Z01419 Encounter for gynecological examination (general) (routine) without abnormal findings: Secondary | ICD-10-CM | POA: Diagnosis not present

## 2016-12-31 DIAGNOSIS — B351 Tinea unguium: Secondary | ICD-10-CM | POA: Diagnosis not present

## 2016-12-31 DIAGNOSIS — E119 Type 2 diabetes mellitus without complications: Secondary | ICD-10-CM

## 2016-12-31 DIAGNOSIS — M8580 Other specified disorders of bone density and structure, unspecified site: Secondary | ICD-10-CM

## 2016-12-31 NOTE — Progress Notes (Signed)
Mary Hunt 05-Apr-1941 ID:[id number]   History:    76 y.o.  for annual gyn exam who presented to the office today with no complaints. Patient was requesting for me to fill out a form to allow her to have a handicap parking so that when her daughter takes her to the [place]  since she has difficulty walking long distances. Her PCP has been doing her blood work and her vaccines are up-to-date. Patient with no previous history of any abnormal Pap smears. Her last bone density study done in 2015 demonstrated the lowest T score was -2.2 at the AP spine left femoral neck -1.5 T score with normal Frax analysis. Patient with past history vitamin D deficiency. Patient 2015 had colon polyps and she is on a 5 year recall. Patient on no hormone replacement therapy. Patient was last seen in the office in 2013. Patient's cousin and aunt with history of breast cancer.  Past medical history,surgical history, family history and social history were all reviewed and documented in the EPIC chart.  Gynecologic History Patient's last menstrual period was 06/09/1991. Contraception: post menopausal status Last Pap: 2012. Results were: normal Last mammogram: 2015. Results were: normal  Obstetric History OB History  Gravida Para Term Preterm AB Living  3 3 3     3   SAB TAB Ectopic Multiple Live Births          3    # Outcome Date GA Lbr Len/2nd Weight Sex Delivery Anes PTL Lv  3 Term     M Vag-Spont  N LIV  2 Term     M Vag-Spont  N LIV  1 Term     F Vag-Spont  N LIV       ROS: A ROS was performed and pertinent positives and negatives are included in the history.  GENERAL: No fevers or chills. HEENT: No change in vision, no earache, sore throat or sinus congestion. NECK: No pain or stiffness. CARDIOVASCULAR: No chest pain or pressure. No palpitations. PULMONARY: No shortness of breath, cough or wheeze. GASTROINTESTINAL: No abdominal pain, nausea, vomiting or diarrhea, melena or bright red blood per rectum.  GENITOURINARY: No urinary frequency, urgency, hesitancy or dysuria. MUSCULOSKELETAL: No joint or muscle pain, no back pain, no recent trauma. DERMATOLOGIC: No rash, no itching, no lesions. ENDOCRINE: No polyuria, polydipsia, no heat or cold intolerance. No recent change in weight. HEMATOLOGICAL: No anemia or easy bruising or bleeding. NEUROLOGIC: No headache, seizures, numbness, tingling or weakness. PSYCHIATRIC: No depression, no loss of interest in normal activity or change in sleep pattern.     Exam: chaperone present  BP 118/80   Ht 4\' 8"  (1.422 m)   Wt 143 lb (64.9 kg)   LMP 06/09/1991   BMI 32.06 kg/m   Body mass index is 32.06 kg/m.  General appearance : Well developed well nourished female. No acute distress HEENT: Eyes: no retinal hemorrhage or exudates,  Neck supple, trachea midline, no carotid bruits, no thyroidmegaly Lungs: Clear to auscultation, no rhonchi or wheezes, or rib retractions  Heart: Regular rate and rhythm, no murmurs or gallops Breast:Examined in sitting and supine position were symmetrical in appearance, no palpable masses or tenderness,  no skin retraction, no nipple inversion, no nipple discharge, no skin discoloration, no axillary or supraclavicular lymphadenopathy Abdomen: no palpable masses or tenderness, no rebound or guarding Extremities: no edema or skin discoloration or tenderness  Pelvic:  Bartholin, Urethra, Skene Glands: Within normal limits  Vagina: No gross lesions or discharge, atrophic changes  Cervix: No gross lesions or discharge  Uterus  anteverted, normal size, shape and consistency, non-tender and mobile  Adnexa  Without masses or tenderness  Anus and perineum  normal   Rectovaginal  normal sphincter tone without palpated masses or tenderness             Hemoccult PCP provides     Assessment/Plan:  76 y.o. female for annual exam with osteopenia and past history vitamin D deficiency and for this reason we are going to check  her vitamin D level today. We discussed importance of calcium vitamin D and weightbearing exercises for osteoporosis prevention. Pap smear no longer needed according to the new guidelines. Patient was provided with a requisition to schedule her mammogram. She will schedule a bone density study here in our office in the next few weeks. She is due for colonoscopy next year.   Terrance Mass MD, 11:19 AM 12/31/2016

## 2016-12-31 NOTE — Progress Notes (Signed)
   Subjective:    Patient ID: Mary Hunt, female    DOB: 1941-09-14, 76 y.o.   MRN: [id number]  HPI presents the office for a diabetic foot exam per her medical doctor is Dr.  Her doctor is Dr.  Luciana Axe.  Her doctor found a skin lesion on the top of her left forefoot which she treated with medicine. She is also desires an evaluation of both feet since this patient is a diabetic.She  states that she has severe bunions with hammer toe right foot.    Review of Systems  All other systems reviewed and are negative.      Objective:   Physical Exam GENERAL APPEARANCE: Alert, conversant. Appropriately groomed. No acute distress.  VASCULAR: Pedal pulses are  palpable at  [facility] and PT bilateral.  Capillary refill time is immediate to all digits,  Normal temperature gradient.  Digital hair growth is present bilateral  NEUROLOGIC: sensation is normal to 5.07 monofilament at 5/5 sites bilateral.  Light touch is intact bilateral, Muscle strength normal.  MUSCULOSKELETAL: acceptable muscle strength, tone and stability bilateral.  Intrinsic muscluature intact bilateral.  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  DERMATOLOGIC: skin color, texture, and turgor are within normal limits.  No preulcerative lesions or ulcers  are seen, no interdigital maceration noted.  No open lesions present.   No drainage noted.  NAILS  Thick disfigured discolored nail right hallux.         Assessment & Plan:  Onychomycosis Right hallux  HAV  B/L  IE  Debride nail right hallux   RTC prn.  Patient's skin lesion on the left foot appears to be healing uneventfully. She does have a healing: heloma molle between the fourth and fifth toes of the right foot.  Evaluation of her diabetic feet reveal they vascular, neurologic and orthopedic findings within normal limits except for the severe HAV deformities bilateral. Will to return to the office in one year for follow-up evaluation her long thick big toenail, right foot  was to provided at this visit   Gardiner Barefoot DPM   Gardiner Barefoot DPM

## 2017-01-12 ENCOUNTER — Ambulatory Visit (INDEPENDENT_AMBULATORY_CARE_PROVIDER_SITE_OTHER): Payer: Medicare Other

## 2017-01-12 ENCOUNTER — Other Ambulatory Visit: Payer: Self-pay | Admitting: Gynecology

## 2017-01-12 DIAGNOSIS — E559 Vitamin D deficiency, unspecified: Secondary | ICD-10-CM

## 2017-01-12 DIAGNOSIS — M818 Other osteoporosis without current pathological fracture: Secondary | ICD-10-CM

## 2017-01-12 DIAGNOSIS — M8580 Other specified disorders of bone density and structure, unspecified site: Secondary | ICD-10-CM

## 2017-01-12 DIAGNOSIS — M810 Age-related osteoporosis without current pathological fracture: Secondary | ICD-10-CM

## 2017-01-13 ENCOUNTER — Encounter: Payer: Self-pay | Admitting: Gynecology

## 2017-01-14 ENCOUNTER — Other Ambulatory Visit: Payer: Self-pay | Admitting: *Deleted

## 2017-01-14 DIAGNOSIS — M818 Other osteoporosis without current pathological fracture: Secondary | ICD-10-CM

## 2017-01-18 ENCOUNTER — Other Ambulatory Visit: Payer: Medicare Other

## 2017-01-18 DIAGNOSIS — M818 Other osteoporosis without current pathological fracture: Secondary | ICD-10-CM

## 2017-01-19 LAB — PTH, INTACT AND CALCIUM
CALCIUM: 9.3 mg/dL (ref 8.6–10.4)
PTH: 31 pg/mL (ref 14–64)

## 2017-01-21 LAB — VITAMIN D 1,25 DIHYDROXY
Vitamin D 1, 25 (OH)2 Total: 23 pg/mL (ref 18–72)
Vitamin D3 1, 25 (OH)2: 23 pg/mL

## 2017-02-12 ENCOUNTER — Encounter: Payer: Self-pay | Admitting: Gynecology

## 2017-02-12 ENCOUNTER — Ambulatory Visit (INDEPENDENT_AMBULATORY_CARE_PROVIDER_SITE_OTHER): Payer: Medicare Other | Admitting: Gynecology

## 2017-02-12 VITALS — BP 122/72 | Ht <= 58 in | Wt 143.2 lb

## 2017-02-12 DIAGNOSIS — M810 Age-related osteoporosis without current pathological fracture: Secondary | ICD-10-CM | POA: Insufficient documentation

## 2017-02-12 NOTE — Progress Notes (Signed)
Patient is a 76 year old who was seen in the office for her annual exam in February of this year see previous note for detail. The reason for today's visit in consultation as a result of her recent bone density study to compare with 2015.  Bone density in 2015 demonstrated the following: AP spine T score -2.2 Left femoral neck T score -1.5 Normal Frax analysis  Bone density in 2018 demonstrated the following: AP spine T score -2.6 Left femoral neck T score -2.0 Statistically significant decrease in bone mineralization of the left femoral neck minus a 0.8% and -5.6% the right femoral neck  These findings indicate that her bone mass is 25% below normal and she has an 11 times greater risk of a hip fracture and 8 times greater risk of a spine fracture.  Normal calcium, vitamin D and PTH level  With the findings now of osteoporosis we discussed the following treatment options: The risk and benefits of oral bisphosphonate therapy were conveyed to the patient in today's visit. Benefits include a significant risk reduction of vertebral, hip and non vertebral fractures. We also discussed potential adverse effects to include precipitation or aggravation of GERD reflux disease as well as the risk of upper GI bleeding although this is reported in the literature to be extremely rare. Other rare adverse effects that were discussed of patient taking oral bisphosphonate included a 1 in 10,000-10,000 risk for osteonecrosis of the jaw, and a risk for atypical femoral fractures of approximately 1 in 5000-10,000. Signs and symptoms of atypical femoral fracture were also discussed and the patient was informed to contact our office if any unusual symptoms were to develop.  The risk and benefits of IV bisphosphonate were discussed with the patient today to include a significant risk reduction for vertebral, hip and non-vertebral fractures. Potential risk of therapy were also discussed to include a 10-15% chance for  a flulike reaction which may last 2-3 days after IV bisphosphonate. We also discussed that longer reactions lasting perhaps weeks to months have been rarely reported to the FDA following IV bisphosphonate treatment. This would also require symptomatic management. Other potential risk that were discussed included the following: Regular eye irritation, approximately 1 and 1000-10,000 risk for osteonecrosis of the jaw as well as a risk for atypical femoral fracture of approximately 1 in 5000-10,000 based on current data on oral bisphosphonate. Signs and symptoms of atypical femoral fracture were also discussed and the patient was asked to contact the office if she develops any of these symptoms.  The risk and benefits of Forteo were discussed with the patient today to include a significant risk reduction for vertebral and nontender vertebral fractures. The most common side effects that have been reported include: Dizziness, and leg cramps although generally not severe enough to warrant discontinuation in the majority of patient on this medication. The black box warning was discussed in depth, which is based on the fact that supra-pharmacological doses of Forteo did increase the risk for benign and malignant bone tumors in rats, though there has not been an observed increase incidence in humans to date, based on over a decade of clinical experience. Patient does not have any relative contraindication to Forteo, including no history of previous therapeutic radiation therapy, active neoplasms involving the skeleton or Paget's disease.  The risk and benefits of Prolia were discussed with the patient today to include a significant risk reduction for vertebral, hip and non-vertebral fractures. Potential risk also discussed were skin advanced to include rash,  pruritus, eczema and other skin reactions. The recurrence of infections and clinical studies with PROLIA, including serious infections were also discussed with the  patient. In 3 year clinical trial with Prolia although six-year trial data does not show any evidence for any additional or accelerating risk of infection. Other risks that were discussed included a 1 in 1000-10,000 risk of osteonecrosis of the jaw based on current available data  Because of patient's history of esophageal reflux and remember to take her medication she will be an ideal candidate for intravenous bisphosphonate such as Reclast once a year.  Literature information was provided in [place]. We will make arrangements for patient's infusion within the next week. All questions answered.  Greater than 90% time was spent counseling and coordinate care for this patient with osteoporosis time of consultation 25 minutes

## 2017-02-12 NOTE — Patient Instructions (Signed)
Zoledronic Acid injection (Paget's Disease, Osteoporosis) Qu es este medicamento? El CIDO ZOLEDRNICO reduce la prdida del calcio de los [place]. Se utiliza para tratar la enfermedad de Paget y osteoporosis en mujeres. Este medicamento puede ser utilizado para otros usos; si tiene alguna pregunta consulte con su proveedor de atencin mdica o con su farmacutico. MARCAS COMUNES: Reclast, Zometa Qu le debo informar a mi profesional de la salud antes de tomar este medicamento? Necesita saber si usted presenta alguno de los siguientes problemas o situaciones: -asma sensible a la aspirina -cncer, especialmente si est recibiendo medicamentos que se usan para tratar el cncer -enfermedad dental o [place] dentadura postiza -infeccin -enfermedad renal -niveles bajos de calcio en la sangre -ciruga previa de la glndula paratiroidea o intestinales -est recibiendo corticoesteroides como dexametasona o prednisona -una reaccin alrgica o inusual al cido zoledrnico, a otros medicamentos, alimentos, colorantes o conservantes -si est embarazada o buscando quedar embarazada -si est amamantando a un beb Cmo debo utilizar este medicamento? Este medicamento se administra mediante infusin por va intravenosa. Lo administra un profesional de Technical sales engineer en un hospital o en un entorno clnico. Hable con su pediatra para informarse acerca del uso de este medicamento en nios. Este medicamento no est aprobado para uso en nios. Sobredosis: Pngase en contacto inmediatamente con un centro toxicolgico o una sala de urgencia si usted cree que haya tomado demasiado medicamento. ATENCIN: [employer] es solo para usted. No comparta este medicamento con nadie. Qu sucede si me olvido de una dosis? Es importante no olvidar ninguna dosis. Informe a su mdico o a su profesional de la salud si no puede asistir a Photographer. Qu puede interactuar con este medicamento? -ciertos antibiticos administrados por  inyeccin -los AINE, medicamentos para Conservation officer, historic buildings o inflamacin, como ibuprofeno o naproxeno -ciertos diurticos bumetanida o furosemida -teriparatida Puede ser que esta lista no menciona todas las posibles interacciones. Informe a su profesional de [employer]	[place] de [employer] productos a base de hierbas, medicamentos de [place] o suplementos nutritivos que est tomando. Si usted fuma, consume bebidas alcohlicas o si utiliza drogas ilegales, indqueselo tambin a su profesional de KB Home	[place]. Algunas sustancias pueden interactuar con su medicamento. A qu debo estar atento al usar Coca[employer]? Visite a su mdico o a su profesional de la salud para chequeos peridicos. Puede ser necesario que transcurra cierto tiempo antes de que pueda observar los beneficios de [place]. No deje de tomar su medicamento excepto si as lo indica su mdico. Su mdico puede pedirle anlisis de [place] u otras pruebas para Chief Technology Officer su evolucin. Las mujeres deben informar a su mdico si estn buscando quedar embarazadas o si creen que estn embarazadas. Existe la posibilidad de efectos secundarios graves a un beb sin nacer. Para ms informacin hable con su profesional de la salud o su farmacutico. Es importante que usted reciba la cantidad [place] de calcio y vitamina D mientras recibe este medicamento. Consulte a su profesional de [employer] alimentos y vitamina que toma. Algunas personas que toman este medicamento experimentan dolor grave de [place], de articulaciones o/y de msculos. Este medicamento tambin puede aumentar el riesgo de problemas de la [place] o un fmur roto. Informe a su mdico inmediatamente si tiene dolor de mandbula, articulaciones o msculos. Si experimenta dolor que no desaparece o que empeora, informe a su mdico. Informe a su dentista y [place] dental que est usando este medicamento. No debera tener Office manager que est [employer]. Visite a  su  dentista para someterse a un examen dental y arreglar cualquier problema dental antes de comenzar este medicamento. Cuide sus dientes mientras est [employer]. Asegrese de visitar a su dentista para citas de control regulares. Qu efectos secundarios puedo tener al [employer] este medicamento? Efectos secundarios que debe informar a su mdico o a Barrister's clerk de la salud tan pronto como sea posible: Chief of Staff, como erupcin cutnea, comezn/picazn o urticarias, e hinchazn de la cara, los labios o la lengua anxiedad, confusin o depresin problemas respiratorios cambios en la visin dolor ocular sensacin de Youth worker o aturdimiento, cadas dolor de la mandbula, especialmente despus de tratamiento odontolgico llagas en la boca calambres, rigidez o debilidad muscular enrojecimiento, formacin de ampollas, descamacin o distensin de la piel, incluso dentro de la boca dificultad para orinar o cambios en el volumen de orina Efectos secundarios que generalmente no requieren atencin mdica (infrmelos a su mdico o a Barrister's clerk de la salud si persisten o si son molestos): dolores en los [place], las articulaciones o los msculos estreimiento diarrea fiebre cada del cabello irritacin en el lugar de la inyeccin prdida del apetito nuseas, vmito Higher education careers adviser dificultad para conciliar el sueo problemas para tragar debilidad o cansancio Puede ser que esta lista no menciona todos los posibles efectos secundarios. Comunquese a su mdico por asesoramiento mdico [employer]. Usted puede informar los efectos secundarios a la FDA por telfono al 1-800-FDA-1088. Dnde debo guardar mi medicina? Este medicamento se administra en hospitales o clnicas y no necesitar guardarlo en su domicilio. ATENCIN: Este folleto es un resumen. Puede ser que no cubra toda la posible informacin. Si usted tiene preguntas acerca de esta medicina, consulte con su mdico, su  farmacutico o su profesional de Technical sales engineer.  2018 Elsevier/Gold Standard (2016-11-26 00:00:00) Osteoporosis (Osteoporosis) La osteoporosis es el debilitamiento y la prdida de la densidad sea. La osteoporosis hace que los huesos se vuelvan ms quebradizos, frgiles propensos a romperse (fractura). Con el tiempo, la osteoporosis puede hacer que los huesos se vuelvan tan dbiles que se fracturan con una simple cada. Los huesos ms propensos a las fracturas son los huesos [place] y la columna vertebral. CAUSAS La causa exacta se desconoce. [place] desarrollar osteoporosis. Puede correr un mayor riesgo si tiene antecedentes familiares de la enfermedad o desnutricin. Tambin puede tener un mayor riesgo si:  Es mujer.  Tiene 50 aos o ms.  Fuma.  No realiza actividad fsica.  Es [place]. SIGNOS Y SNTOMAS La fractura puede ser el primer signo de la enfermedad, en especial si se origina a causa de una cada o lesin que, por lo general, no provocara una fractura de hueso. Otros signos y sntomas incluyen lo siguiente:  Dolor en la parte inferior de la espalda y el cuello.  Postura encorvada.  Prdida de la altura. DIAGNSTICO Para realizar un diagnstico, el mdico:  Psychologist, sport and exercise.  Realizar un examen fsico.  Production manager, como:  Una prueba de la densidad sea mineral.  Una absorciometra con rayos X de doble energa. [place] tratamiento para la osteoporosis es fortalecer los [place], a fin de reducir el riesgo de fracturas. El tratamiento incluye lo siguiente:  Cambios de estilo de vida, como:  Seguir una dieta con alto contenido de calcio.  Hacer ejercicios de levantamiento de pesas y fortalecimiento muscular.  Dejar de consumir tabaco.  Limitar el consumo de bebidas alcohlicas.  Tomar medicamentos para retrasar el proceso de prdida sea o  aumentar la densidad sea.  Controlar los niveles de calcio y vitamina D. INSTRUCCIONES PARA EL CUIDADO EN EL HOGAR  Incluya calcio y vitamina D en su dieta. El calcio es importante para la salud sea, y la vitamina D ayuda al organismo a Astronomer calcio.  Realice ejercicios de levantamiento de pesas y fortalecimiento muscular como se lo haya indicado el mdico.  No consuma ningn producto que contenga tabaco, lo que incluye cigarrillos, tabaco de Higher education careers adviser o Psychologist, sport and exercise. Si necesita ayuda para dejar de fumar, consulte al mdico.  Limite el consumo de bebidas alcohlicas.  Tome los medicamentos solamente como se lo haya indicado el mdico.  Concurra a todas las visitas de control como se lo haya indicado el mdico. Esto es importante.  Tome las precauciones necesarias en su casa para reducir el riesgo de cadas, como:  Mantener las habitaciones bien iluminadas y libres de obstculos.  Instalar barandas de seguridad en las escaleras.  Usar tapetes de caucho en el bao y en otros sectores que a menudo estn mojadas o son resbaladizas. SOLICITE ATENCIN MDICA DE INMEDIATO SI: Sufre una cada o lesin. Esta informacin no tiene Marine scientist el consejo del mdico. Asegrese de hacerle al mdico cualquier pregunta que tenga. Document Released: 08/05/2005 Document Revised: 02/17/2016 Document Reviewed: 04/05/2014 Elsevier Interactive Patient Education  2017 [employer].

## 2017-02-15 ENCOUNTER — Telehealth: Payer: Self-pay | Admitting: Gynecology

## 2017-02-15 DIAGNOSIS — M810 Age-related osteoporosis without current pathological fracture: Secondary | ICD-10-CM

## 2017-02-15 NOTE — Telephone Encounter (Signed)
Pam please make arrangements for this patient to receive Reclast IV. She has osteoporosis and has history of esophageal reflux. See encountered note from today 02/12/2017. Thank you  Note from Dr Toney Rakes.

## 2017-02-17 NOTE — Telephone Encounter (Signed)
Reclast blood work 02/22/17 grandaughter requests April 24 at 2pm for Reclast. Pt has Medicare primary and Medicaid secondary.

## 2017-02-22 ENCOUNTER — Other Ambulatory Visit: Payer: Medicare Other

## 2017-02-22 DIAGNOSIS — M810 Age-related osteoporosis without current pathological fracture: Secondary | ICD-10-CM

## 2017-02-22 LAB — CALCIUM: Calcium: 9.4 mg/dL (ref 8.6–10.4)

## 2017-02-22 LAB — CREATININE, SERUM: CREATININE: 0.83 mg/dL (ref 0.60–0.93)

## 2017-02-22 LAB — BUN: BUN: 16 mg/dL (ref 7–25)

## 2017-02-23 NOTE — Telephone Encounter (Addendum)
Results for Mary Hunt, Mary Hunt (MRN [id number]) as of 02/23/2017 09:53  Ref. Range 02/22/2017 09:59  BUN Latest Ref Range: 7 - 25 mg/dL 16  Creatinine Latest Ref Range: 0.60 - 0.93 mg/dL 0.83  Calcium Latest Ref Range: 8.6 - 10.4 mg/dL 9.4  Labs normal. Patient appointment 03/04/2017 at 1 pm  Left Vm for Daughter Santiago Glad

## 2017-02-26 NOTE — Telephone Encounter (Signed)
Lm for daughter to confirm appointment

## 2017-03-03 ENCOUNTER — Other Ambulatory Visit (HOSPITAL_COMMUNITY): Payer: Self-pay | Admitting: *Deleted

## 2017-03-04 ENCOUNTER — Ambulatory Visit (HOSPITAL_COMMUNITY)
Admission: RE | Admit: 2017-03-04 | Discharge: 2017-03-04 | Disposition: A | Discharge status: Home / Self Care | Payer: Medicare Other | Source: Ambulatory Visit | Attending: Gynecology | Admitting: Gynecology

## 2017-03-04 DIAGNOSIS — K219 Gastro-esophageal reflux disease without esophagitis: Secondary | ICD-10-CM | POA: Insufficient documentation

## 2017-03-04 DIAGNOSIS — M810 Age-related osteoporosis without current pathological fracture: Secondary | ICD-10-CM | POA: Insufficient documentation

## 2017-03-04 MED ORDER — ZOLEDRONIC ACID 5 MG/100ML IV SOLN
INTRAVENOUS | Status: AC
  Filled 2017-03-04: qty 100

## 2017-03-04 MED ORDER — ZOLEDRONIC ACID 5 MG/100ML IV SOLN
5.0000 mg | Freq: Once | INTRAVENOUS | Status: AC
  Administered 2017-03-04: 13:00:00 5 mg via INTRAVENOUS

## 2017-03-04 NOTE — Discharge Instructions (Signed)
Zoledronic Acid injection (Paget's Disease, Osteoporosis) Qu es este medicamento? El CIDO ZOLEDRNICO reduce la prdida del calcio de los [place]. Se utiliza para tratar la enfermedad de Paget y osteoporosis en mujeres. Este medicamento puede ser utilizado para otros usos; si tiene alguna pregunta consulte con su proveedor de atencin mdica o con su farmacutico. MARCAS COMUNES: Reclast, Zometa Qu le debo informar a mi profesional de la salud antes de tomar este medicamento? Necesita saber si usted presenta alguno de los siguientes problemas o situaciones: -asma sensible a la aspirina -cncer, especialmente si est recibiendo medicamentos que se usan para tratar el cncer -enfermedad dental o [place] dentadura postiza -infeccin -enfermedad renal -niveles bajos de calcio en la sangre -ciruga previa de la glndula paratiroidea o intestinales -est recibiendo corticoesteroides como dexametasona o prednisona -una reaccin alrgica o inusual al cido zoledrnico, a otros medicamentos, alimentos, colorantes o conservantes -si est embarazada o buscando quedar embarazada -si est amamantando a un beb Cmo debo utilizar este medicamento? Este medicamento se administra mediante infusin por va intravenosa. Lo administra un profesional de Technical sales engineer en un hospital o en un entorno clnico. Hable con su pediatra para informarse acerca del uso de este medicamento en nios. Este medicamento no est aprobado para uso en nios. Sobredosis: Pngase en contacto inmediatamente con un centro toxicolgico o una sala de urgencia si usted cree que haya tomado demasiado medicamento. ATENCIN: [employer] es solo para usted. No comparta este medicamento con nadie. Qu sucede si me olvido de una dosis? Es importante no olvidar ninguna dosis. Informe a su mdico o a su profesional de la salud si no puede asistir a Photographer. Qu puede interactuar con este medicamento? -ciertos antibiticos administrados por  inyeccin -los AINE, medicamentos para Conservation officer, historic buildings o inflamacin, como ibuprofeno o naproxeno -ciertos diurticos bumetanida o furosemida -teriparatida Puede ser que esta lista no menciona todas las posibles interacciones. Informe a su profesional de [employer] Home	[place] de [employer] productos a base de hierbas, medicamentos de [place] o suplementos nutritivos que est tomando. Si usted fuma, consume bebidas alcohlicas o si utiliza drogas ilegales, indqueselo tambin a su profesional de [employer]	[place]. Algunas sustancias pueden interactuar con su medicamento. A qu debo estar atento al usar Coca[employer]Cola? Visite a su mdico o a su profesional de la salud para chequeos peridicos. Puede ser necesario que transcurra cierto tiempo antes de que pueda observar los beneficios de [place]. No deje de tomar su medicamento excepto si as lo indica su mdico. Su mdico puede pedirle anlisis de [place] u otras pruebas para Chief Technology Officer su evolucin. Las mujeres deben informar a su mdico si estn buscando quedar embarazadas o si creen que estn embarazadas. Existe la posibilidad de efectos secundarios graves a un beb sin nacer. Para ms informacin hable con su profesional de la salud o su farmacutico. Es importante que usted reciba la cantidad [place] de calcio y vitamina D mientras recibe este medicamento. Consulte a su profesional de [employer] alimentos y vitamina que toma. Algunas personas que toman este medicamento experimentan dolor grave de [place], de articulaciones o/y de msculos. Este medicamento tambin puede aumentar el riesgo de problemas de la [place] o un fmur roto. Informe a su mdico inmediatamente si tiene dolor de mandbula, articulaciones o msculos. Si experimenta dolor que no desaparece o que empeora, informe a su mdico. Informe a su dentista y [place] dental que est usando este medicamento. No debera tener Office manager que est [employer]. Visite a  su  dentista para someterse a un examen dental y arreglar cualquier problema dental antes de comenzar este medicamento. Cuide sus dientes mientras est [employer]. Asegrese de visitar a su dentista para citas de control regulares. Qu efectos secundarios puedo tener al [employer] este medicamento? Efectos secundarios que debe informar a su mdico o a Barrister's clerk de la salud tan pronto como sea posible: Chief of Staff, como erupcin cutnea, comezn/picazn o urticarias, e hinchazn de la cara, los labios o la lengua anxiedad, confusin o depresin problemas respiratorios cambios en la visin dolor ocular sensacin de Youth worker o aturdimiento, cadas dolor de la mandbula, especialmente despus de tratamiento odontolgico llagas en la boca calambres, rigidez o debilidad muscular enrojecimiento, formacin de ampollas, descamacin o distensin de la piel, incluso dentro de la boca dificultad para orinar o cambios en el volumen de orina Efectos secundarios que generalmente no requieren atencin mdica (infrmelos a su mdico o a Barrister's clerk de la salud si persisten o si son molestos): dolores en los [place], las articulaciones o los msculos estreimiento diarrea fiebre cada del cabello irritacin en el lugar de la inyeccin prdida del apetito nuseas, vmito Higher education careers adviser dificultad para conciliar el sueo problemas para tragar debilidad o cansancio Puede ser que esta lista no menciona todos los posibles efectos secundarios. Comunquese a su mdico por asesoramiento mdico [employer]. Usted puede informar los efectos secundarios a la FDA por telfono al 1-800-FDA-1088. Dnde debo guardar mi medicina? Este medicamento se administra en hospitales o clnicas y no necesitar guardarlo en su domicilio. ATENCIN: Este folleto es un resumen. Puede ser que no cubra toda la posible informacin. Si usted tiene preguntas acerca de esta medicina, consulte con su mdico, su  farmacutico o su profesional de Technical sales engineer.  2018 Elsevier/Gold Standard (2016-11-26 00:00:00)

## 2017-03-08 NOTE — Telephone Encounter (Signed)
Reclast given 03/04/17, next due 03/05/18

## 2017-03-24 ENCOUNTER — Encounter: Payer: Self-pay | Admitting: Gynecology

## 2018-01-20 ENCOUNTER — Encounter: Payer: Self-pay | Admitting: Gynecology

## 2018-01-20 ENCOUNTER — Ambulatory Visit (INDEPENDENT_AMBULATORY_CARE_PROVIDER_SITE_OTHER): Payer: Medicare Other | Admitting: Gynecology

## 2018-01-20 VITALS — BP 124/80 | Ht <= 58 in | Wt 135.0 lb

## 2018-01-20 DIAGNOSIS — N952 Postmenopausal atrophic vaginitis: Secondary | ICD-10-CM | POA: Diagnosis not present

## 2018-01-20 DIAGNOSIS — Z0000 Encounter for general adult medical examination without abnormal findings: Secondary | ICD-10-CM

## 2018-01-20 DIAGNOSIS — Z01411 Encounter for gynecological examination (general) (routine) with abnormal findings: Secondary | ICD-10-CM | POA: Diagnosis not present

## 2018-01-20 DIAGNOSIS — M810 Age-related osteoporosis without current pathological fracture: Secondary | ICD-10-CM | POA: Diagnosis not present

## 2018-01-20 NOTE — Patient Instructions (Addendum)
Office will call you to arrange for the osteoporosis medication.  Call the office if you do not hear from them within 2 weeks.   Call to Schedule your mammogram  Facilities in [place]: 1)  [facility] [place] Phone: [phone] 2)  Dr. Isaiah Blakes at [facility] [address] Phone: [phone]     Mammogram A mammogram is an X-ray test to find changes in a woman's breast. You should get a mammogram if:  You are 79 years of age or older  You have risk factors.   Your doctor recommends that you have one.  BEFORE THE TEST  Do not schedule the test the week before your period, especially if your breasts are sore during this time.  On the day of your mammogram:  Wash your breasts and armpits well. After washing, do not put on any deodorant or talcum powder on until after your test.   Eat and drink as you usually do.   Take your medicines as usual.   If you are diabetic and take insulin, make sure you:   Eat before coming for your test.   Take your insulin as usual.   If you cannot keep your appointment, call before the appointment to cancel. Schedule another appointment.  TEST  You will need to undress from the waist up. You will put on a hospital gown.   Your breast will be put on the mammogram machine, and it will press firmly on your breast with a piece of plastic called a compression paddle. This will make your breast flatter so that the machine can X-ray all parts of your breast.   Both breasts will be X-rayed. Each breast will be X-rayed from above and from the side. An X-ray might need to be taken again if the picture is not good enough.   The mammogram will last about 15 to 30 minutes.  AFTER THE TEST Finding out the results of your test Ask when your test results will be ready. Make sure you get your test results.  Document Released: 01/22/2009 Document Revised: 10/15/2011  Document Reviewed: 01/22/2009 [facility] Patient Information 2012 [place].

## 2018-01-20 NOTE — Progress Notes (Signed)
    Mary Hunt 02-15-1941 [id number]        76 y.o.  G3P3003 for breast and pelvic exam.  Former patient of Dr. Toney Rakes.  Daughter accompanies to translate.  Without reported GYN issues.  Started Reclast last year for osteoporosis due this coming month.  Past medical history,surgical history, problem list, medications, allergies, family history and social history were all reviewed and documented as reviewed in the EPIC chart.  ROS:  Performed with pertinent positives and negatives included in the history, assessment and plan.   Additional significant findings : None   Exam: Wandra Scot assistant Vitals:   01/20/18 1200  BP: 124/80  Weight: 135 lb (61.2 kg)  Height: 4\' 10"  (1.473 m)   Body mass index is 28.22 kg/m.  General appearance:  Normal affect, orientation and appearance. Skin: Grossly normal HEENT: Without gross lesions.  No cervical or supraclavicular adenopathy. Thyroid normal.  Lungs:  Clear without wheezing, rales or rhonchi Cardiac: RR, without RMG Abdominal:  Soft, nontender, without masses, guarding, rebound, organomegaly or hernia Breasts:  Examined lying and sitting without masses, retractions, discharge or axillary adenopathy. Pelvic:  Ext, BUS, Vagina: With atrophic changes  Cervix: With atrophic changes  Uterus: Axial to anteverted, normal size, shape and contour, midline and mobile nontender   Adnexa: Without masses or tenderness    Anus and perineum: Normal   Rectovaginal: Normal sphincter tone without palpated masses or tenderness.    Assessment/Plan:  77 y.o. G42P3003 female for breast and pelvic exam.   1. Without reported hot flushes, night sweats, vaginal dryness or any vaginal bleeding. 2. Osteoporosis.  DEXA 2018 T score -2.6.  Received 1 dose of Reclast a year ago.  We will make arrangements for infusion now.  Patient knows to call if she does not hear about arranging the appointment within the next 2 weeks. 3. Mammography 2015.  Need for  baseline mammogram reviewed.  Names and numbers provided.  Breast exam normal today. 4. Colonoscopy 2015.  Repeat at their recommended interval. 5. Pap smear 2012.  No Pap smear done today.  No reported abnormal Pap smears.  Per current screening guidelines will plan stop screening based on age. 6. Health maintenance.  No routine lab work done his this is done elsewhere.  Follow-up for Reclast.  Follow-up for mammogram.  Follow-up for exam in 1 year.   Anastasio Auerbach MD, 12:18 PM 01/20/2018

## 2018-01-31 ENCOUNTER — Telehealth: Payer: Self-pay | Admitting: Gynecology

## 2018-01-31 DIAGNOSIS — M810 Age-related osteoporosis without current pathological fracture: Secondary | ICD-10-CM

## 2018-01-31 NOTE — Telephone Encounter (Signed)
P had complete exam with Dr Phineas Real 01/20/18 with approval of Reclast. Pt due for injection 03/05/18. She will need insurance verity and blood work prior to injection. Calcium and Creatinine.

## 2018-02-14 NOTE — Telephone Encounter (Signed)
Labwork for April 30th  At 830 am for Calcium and Creatinine level for Reclast.  New Medicaid card has not arrived (2019) . Grandaughter will fax in card when she gets it.  Talked with Grandaughter Barnetta Hammersmith.   Reclast due after 03/06/18

## 2018-03-08 ENCOUNTER — Other Ambulatory Visit: Payer: Medicare Other

## 2018-03-08 DIAGNOSIS — M810 Age-related osteoporosis without current pathological fracture: Secondary | ICD-10-CM

## 2018-03-08 LAB — CALCIUM: CALCIUM: 9.9 mg/dL (ref 8.6–10.4)

## 2018-03-08 LAB — CREATININE, SERUM: CREATININE: 0.79 mg/dL (ref 0.60–0.93)

## 2018-03-09 NOTE — Telephone Encounter (Addendum)
Recast instructions  Annual Exam (1 year) ? 01/20/18 TF  Called pt to verify insurance coverage? YES   Informed pt Reclast is in Process ? YES  Labs must be in 30 days window Serum Creatinine 0.79   DATE? 03/08/18 Serum Calcium   03/08/18   DATE? 03/08/18   PA needed?NO (refernce [id number])  Cost for Pt? $0 pt has medicare and Medicaid  Order form filled out and faxed  w/MD sig?YES and FAXED appt 03/18/18 at 9:00am  Infusion will be done at ? [facility]  Pt aware? YES  Instructions mailed to pt? YES

## 2018-03-09 NOTE — Telephone Encounter (Signed)
Creat 0.60 - 0.93 mg/dL 0.79    Calcium 8.6 - 10.4 mg/dL 9.9     Lab results are above.   Labs were drawn on 03/08/18  Insurance Medicare primary , Medicaid secondary.  Reclast continued per Dr Phineas Real. Due after 03/04/18 Will forward this to [place] , infusion form to be filled out and need to schedule infusion at [facility].   Need to call granddaughter to schedule. Santiago Glad) limited dates available for her to bring her to site.  Will forward to Kim A to schedule at [facility] with date .  Instruction sheet to be mailed.

## 2018-03-17 ENCOUNTER — Other Ambulatory Visit (HOSPITAL_COMMUNITY): Payer: Self-pay

## 2018-03-18 ENCOUNTER — Ambulatory Visit (HOSPITAL_COMMUNITY)
Admission: RE | Admit: 2018-03-18 | Discharge: 2018-03-18 | Disposition: A | Discharge status: Home / Self Care | Payer: Medicare Other | Source: Ambulatory Visit | Attending: Gynecology | Admitting: Gynecology

## 2018-03-18 DIAGNOSIS — M810 Age-related osteoporosis without current pathological fracture: Secondary | ICD-10-CM | POA: Diagnosis present

## 2018-03-18 MED ORDER — ZOLEDRONIC ACID 5 MG/100ML IV SOLN
INTRAVENOUS | Status: AC
  Administered 2018-03-18: 09:00:00 5 mg via INTRAVENOUS
  Filled 2018-03-18: qty 100

## 2018-03-18 MED ORDER — ZOLEDRONIC ACID 5 MG/100ML IV SOLN
5.0000 mg | Freq: Once | INTRAVENOUS | Status: AC
  Administered 2018-03-18: 5 mg via INTRAVENOUS

## 2018-03-25 ENCOUNTER — Encounter: Payer: Self-pay | Admitting: Gynecology

## 2018-03-25 NOTE — Telephone Encounter (Signed)
Reclast given 03/18/18 Next IV 03/20/2019

## 2018-09-13 ENCOUNTER — Ambulatory Visit: Payer: Medicare Other | Admitting: Physical Therapy

## 2018-09-15 ENCOUNTER — Encounter: Payer: Self-pay | Admitting: Physical Therapy

## 2018-09-15 ENCOUNTER — Ambulatory Visit: Payer: Medicare Other | Attending: Family Medicine | Admitting: Physical Therapy

## 2018-09-15 DIAGNOSIS — M545 Low back pain, unspecified: Secondary | ICD-10-CM

## 2018-09-15 DIAGNOSIS — M62830 Muscle spasm of back: Secondary | ICD-10-CM | POA: Diagnosis present

## 2018-09-15 DIAGNOSIS — R262 Difficulty in walking, not elsewhere classified: Secondary | ICD-10-CM | POA: Insufficient documentation

## 2018-09-15 NOTE — Therapy (Signed)
[place] [address] [place], [place], [address] Phone: [phone]   Fax:  [phone]  Physical Therapy Evaluation  Patient Details  Name: Mary Hunt MRN: [id number] Date of Birth: Oct 24, 1941 Referring Provider (PT): Precious Haws   Encounter Date: 09/15/2018  PT End of Session - 09/15/18 1557    Visit Number  1    Date for PT Re-Evaluation  11/15/18    PT Start Time  1519    PT Stop Time  1600    PT Time Calculation (min)  41 min    Activity Tolerance  Patient tolerated treatment well    Behavior During Therapy  [facility] for tasks assessed/performed       Past Medical History:  Diagnosis Date  . Arthritis   . Colon polyps   . Depression   . Diabetes mellitus    TYPE 2  . Hypertension   . Osteoporosis 12/2016  . Vitamin D deficiency     Past Surgical History:  Procedure Laterality Date  . APPENDECTOMY    . BREAST SURGERY     BREAST BIOPSY-BENIGN    There were no vitals filed for this visit.   Subjective Assessment - 09/15/18 1521    Subjective  Patient present with LBP, she reports that she has not had issues previously, reports that she is unsure of cause reports it started 5 months ago, X-rays show DDD, listhesis is present    Limitations  Sitting    Patient Stated Goals  have less pain    Currently in Pain?  Yes    Pain Score  6     Pain Location  Back    Pain Orientation  Lower    Pain Descriptors / Indicators  Tightness    Pain Type  Acute pain    Pain Radiating Towards  denies    Pain Onset  More than a month ago    Pain Frequency  Constant    Aggravating Factors   sitting > 5 minutes, first thing in the morning pain up to 8/10     Pain Relieving Factors  brace helps, voltaren gel, at best a 4/10    Effect of Pain on Daily Activities  limits ADL's         [facility] PT Assessment - 09/15/18 0001      Assessment   Medical Diagnosis  LBP    Referring Provider (PT)  Precious Haws    Onset  Date/Surgical Date  08/15/18    Prior Therapy  no      Precautions   Precautions  None      Balance Screen   Has the patient fallen in the past 6 months  No    Has the patient had a decrease in activity level because of a fear of falling?   No    Is the patient reluctant to leave their home because of a fear of falling?   No      Home Environment   Additional Comments  does housework, cares for a 27 month old at her home      Prior Function   Level of Independence  Independent    Vocation  Retired    Leisure  likes to walk      [employer] Comments  fwd head, rounded shoulders      ROM / Strength   AROM / PROM / Strength  AROM;Strength      AROM  Overall AROM Comments  Lumbar ROM decreased 25% with mild pain in the low back      Strength   Overall Strength Comments  strength 4-/5 with some pain in the hips and the low back      Flexibility   Soft Tissue Assessment /Muscle Length  yes    Hamstrings  tight with some pain    Piriformis  tight with some pain      Palpation   Palpation comment  spasms and tenderness in the lumbar paraspinals      Ambulation/Gait   Gait Comments  first few steps after sitting are stooped forward and very slow, takes about 10 - 15 feet before she can stand up straight                Objective measurements completed on examination: See above findings.      [facility] Adult PT Treatment/Exercise - 09/15/18 0001      Modalities   Modalities  Electrical Stimulation;Moist Heat      Moist Heat Therapy   Number Minutes Moist Heat  15 Minutes    Moist Heat Location  Lumbar Spine      Electrical Stimulation   Electrical Stimulation Location  lumbar    Electrical Stimulation Action  IFC    Electrical Stimulation Parameters  patient preferred prone    Electrical Stimulation Goals  Pain             PT Education - 09/15/18 1556    Education Details  Wms flexion    Person(s) Educated  Patient    Methods   Explanation;Demonstration;Handout    Comprehension  Verbalized understanding       PT Short Term Goals - 09/15/18 1601      PT SHORT TERM GOAL #1   Title  independent with initial HEP    Time  2    Period  Weeks    Status  New        PT Long Term Goals - 09/15/18 1602      PT LONG TERM GOAL #1   Title  decrease pain 50%    Time  8    Period  Weeks    Status  New      PT LONG TERM GOAL #2   Title  understand proper posture and body mechanics    Time  8    Period  Weeks    Status  New      PT LONG TERM GOAL #3   Title  increase lumbar ROM to WNL's    Time  8    Period  Weeks    Status  New      PT LONG TERM GOAL #4   Title  report that she can get up from sitting without struggling to walk    Time  8    Period  Weeks    Status  New             Plan - 09/15/18 1558    Clinical Impression Statement  Patient reports about 5 months of low back pain, unsure of a cause, x-ray shows DDD and anterolisthesis at L4-5.  She reports that she is worse in the AM and worse after sitting, her first few steps after sitting she has difficulty straightening up, has some spasms, she wears a girdle and reports that this makeds the back feel better.      History and Personal Factors relevant to plan of care:  c/o some dizziness at times    Clinical Presentation  Stable    Clinical Decision Making  Low    Rehab Potential  Good    PT Frequency  1x / week    PT Duration  8 weeks    PT Treatment/Interventions  ADLs/Self Care Home Management;Cryotherapy;Electrical Stimulation;Moist Heat;Traction;Therapeutic exercise;Therapeutic activities;Neuromuscular re-education;Manual techniques;Patient/family education    PT Next Visit Plan  start core stability, treat pain as needed    Consulted and Agree with Plan of Care  Patient       Patient will benefit from skilled therapeutic intervention in order to improve the following deficits and impairments:  Abnormal gait, Decreased range of  motion, Difficulty walking, Increased muscle spasms, Decreased activity tolerance, Pain, Improper body mechanics, Impaired flexibility, Decreased strength, Postural dysfunction  Visit Diagnosis: Acute midline low back pain without sciatica - Plan: PT plan of care cert/re-cert  Muscle spasm of back - Plan: PT plan of care cert/re-cert  Difficulty in walking, not elsewhere classified - Plan: PT plan of care cert/re-cert     Problem List Patient Active Problem List   Diagnosis Date Noted  . Age-related osteoporosis without current pathological fracture 02/12/2017  . Vitamin D deficiency 12/31/2016  . Vaginal atrophy 02/01/2014  . Psoriasis 10/12/2012  . HEMORRHOIDS, INTERNAL 12/06/2008  . RECTAL BLEEDING 12/06/2008  . COLONIC POLYPS 12/03/2008  . ESSENTIAL HYPERTENSION 12/03/2008  . HEMORRHOIDS, EXTERNAL 12/03/2008  . DIVERTICULOSIS, COLON 12/03/2008  . ARTHRITIS 12/03/2008  . NONSPEC ELEVATION OF LEVELS OF TRANSAMINASE/LDH 12/03/2008    Sumner Boast., PT 09/15/2018, 4:05 PM  [address] [place], [place], [address] Phone: [phone]   Fax:  [phone]  Name: Mary Hunt MRN: [id number] Date of Birth: 12-Apr-1941

## 2018-09-22 ENCOUNTER — Encounter: Payer: Self-pay | Admitting: Physical Therapy

## 2018-09-22 ENCOUNTER — Ambulatory Visit: Payer: Medicare Other | Admitting: Physical Therapy

## 2018-09-22 DIAGNOSIS — M545 Low back pain, unspecified: Secondary | ICD-10-CM

## 2018-09-22 DIAGNOSIS — M62830 Muscle spasm of back: Secondary | ICD-10-CM

## 2018-09-22 DIAGNOSIS — R262 Difficulty in walking, not elsewhere classified: Secondary | ICD-10-CM

## 2018-09-22 NOTE — Therapy (Signed)
[place] [address] [place], [place], [address] Phone: [phone]   Fax:  [phone]  Physical Therapy Treatment  Patient Details  Name: Mary Hunt MRN: [id number] Date of Birth: Sep 22, 1941 Referring Provider (PT): Precious Haws   Encounter Date: 09/22/2018  PT End of Session - 09/22/18 0835    Visit Number  2    PT Start Time  0758    PT Stop Time  0847    PT Time Calculation (min)  49 min    Activity Tolerance  Patient tolerated treatment well    Behavior During Therapy  [facility] for tasks assessed/performed       Past Medical History:  Diagnosis Date  . Arthritis   . Colon polyps   . Depression   . Diabetes mellitus    TYPE 2  . Hypertension   . Osteoporosis 12/2016  . Vitamin D deficiency     Past Surgical History:  Procedure Laterality Date  . APPENDECTOMY    . BREAST SURGERY     BREAST BIOPSY-BENIGN    There were no vitals filed for this visit.  Subjective Assessment - 09/22/18 0803    Subjective  Patient reports no issues with the HEP, she reports that she wakes up sore and stiff in the AM.      Currently in Pain?  Yes    Pain Score  5     Pain Location  Back    Pain Orientation  Right;Lower    Pain Descriptors / Indicators  Tightness                       [facility] Adult PT Treatment/Exercise - 09/22/18 0001      Exercises   Exercises  Lumbar      Lumbar Exercises: Stretches   Passive Hamstring Stretch  4 reps;20 seconds    Piriformis Stretch  4 reps;20 seconds      Lumbar Exercises: Aerobic   Nustep  level 4 x 5 minutes      Lumbar Exercises: Machines for Strengthening   Cybex Knee Extension  5# 2x5    Cybex Knee Flexion  15# 2x10      Lumbar Exercises: Standing   Row  Both;20 reps;Theraband    Theraband Level (Row)  Level 2 (Red)    Shoulder Extension  Both;20 reps;Theraband    Theraband Level (Shoulder Extension)  Level 2 (Red)      Lumbar Exercises: Supine    Bridge with Ball Squeeze  20 reps;2 seconds    Other Supine Lumbar Exercises  Reviewed the Wms flexion exercises and had her demo    Other Supine Lumbar Exercises  feet on ball K2C, trunk rotation, bridges and isometric abs      Modalities   Modalities  Electrical Stimulation;Moist Heat      Moist Heat Therapy   Number Minutes Moist Heat  15 Minutes    Moist Heat Location  Lumbar Spine      Electrical Stimulation   Electrical Stimulation Location  lumbar    Electrical Stimulation Action  IFC    Electrical Stimulation Parameters  right side lying    Electrical Stimulation Goals  Pain               PT Short Term Goals - 09/22/18 0841      PT SHORT TERM GOAL #1   Title  independent with initial HEP    Status  Achieved  PT Long Term Goals - 09/15/18 1602      PT LONG TERM GOAL #1   Title  decrease pain 50%    Time  8    Period  Weeks    Status  New      PT LONG TERM GOAL #2   Title  understand proper posture and body mechanics    Time  8    Period  Weeks    Status  New      PT LONG TERM GOAL #3   Title  increase lumbar ROM to WNL's    Time  8    Period  Weeks    Status  New      PT LONG TERM GOAL #4   Title  report that she can get up from sitting without struggling to walk    Time  8    Period  Weeks    Status  New            Plan - 09/22/18 1572    Clinical Impression Statement  Patient did well with the initiation of exercises.  She did have some pain in the knees.  She needs core strength.  Will need education about safety and body mechanics    PT Next Visit Plan  continue core stability, treat pain as needed    Consulted and Agree with Plan of Care  Patient       Patient will benefit from skilled therapeutic intervention in order to improve the following deficits and impairments:  Abnormal gait, Decreased range of motion, Difficulty walking, Increased muscle spasms, Decreased activity tolerance, Pain, Improper body mechanics,  Impaired flexibility, Decreased strength, Postural dysfunction  Visit Diagnosis: Acute midline low back pain without sciatica  Muscle spasm of back  Difficulty in walking, not elsewhere classified     Problem List Patient Active Problem List   Diagnosis Date Noted  . Age-related osteoporosis without current pathological fracture 02/12/2017  . Vitamin D deficiency 12/31/2016  . Vaginal atrophy 02/01/2014  . Psoriasis 10/12/2012  . HEMORRHOIDS, INTERNAL 12/06/2008  . RECTAL BLEEDING 12/06/2008  . COLONIC POLYPS 12/03/2008  . ESSENTIAL HYPERTENSION 12/03/2008  . HEMORRHOIDS, EXTERNAL 12/03/2008  . DIVERTICULOSIS, COLON 12/03/2008  . ARTHRITIS 12/03/2008  . NONSPEC ELEVATION OF LEVELS OF TRANSAMINASE/LDH 12/03/2008    Sumner Boast., PT 09/22/2018, 8:42 AM  [place] [address] [place], [place], [address] Phone: [phone]   Fax:  [phone]  Name: Mary Hunt MRN: [id number] Date of Birth: Jan 21, 1941

## 2018-09-29 ENCOUNTER — Ambulatory Visit: Payer: Medicare Other | Admitting: Physical Therapy

## 2018-09-29 DIAGNOSIS — R262 Difficulty in walking, not elsewhere classified: Secondary | ICD-10-CM

## 2018-09-29 DIAGNOSIS — M62830 Muscle spasm of back: Secondary | ICD-10-CM

## 2018-09-29 DIAGNOSIS — M545 Low back pain, unspecified: Secondary | ICD-10-CM

## 2018-09-29 NOTE — Therapy (Signed)
[place] [address] [place], [place], [address] Phone: [phone]   Fax:  [phone]  Physical Therapy Treatment  Patient Details  Name: Mary Hunt MRN: [id number] Date of Birth: Sep 26, 1941 Referring Provider (PT): Precious Haws   Encounter Date: 09/29/2018  PT End of Session - 09/29/18 1744    Visit Number  3    Date for PT Re-Evaluation  11/15/18    PT Start Time  1700    PT Stop Time  1755    PT Time Calculation (min)  55 min    Activity Tolerance  Patient tolerated treatment well    Behavior During Therapy  [facility] for tasks assessed/performed       Past Medical History:  Diagnosis Date  . Arthritis   . Colon polyps   . Depression   . Diabetes mellitus    TYPE 2  . Hypertension   . Osteoporosis 12/2016  . Vitamin D deficiency     Past Surgical History:  Procedure Laterality Date  . APPENDECTOMY    . BREAST SURGERY     BREAST BIOPSY-BENIGN    There were no vitals filed for this visit.  Subjective Assessment - 09/29/18 1704    Subjective  "I am doing good, I just have pain in my lower back"    Currently in Pain?  Yes    Pain Score  8     Pain Location  Back    Pain Orientation  Right;Lower                       [facility] Adult PT Treatment/Exercise - 09/29/18 0001      Exercises   Exercises  Lumbar      Lumbar Exercises: Aerobic   Nustep  level 4 x 6 minutes      Lumbar Exercises: Machines for Strengthening   Cybex Knee Extension  5# 2x10    Cybex Knee Flexion  15# 2x10      Lumbar Exercises: Standing   Row  Both;20 reps;Theraband    Theraband Level (Row)  Level 2 (Red)    Shoulder Extension  Both;20 reps;Theraband    Theraband Level (Shoulder Extension)  Level 2 (Red)    Other Standing Lumbar Exercises  hip 3 way      Lumbar Exercises: Supine   Bridge with Ball Squeeze  20 reps;2 seconds    Bridge with clamshell  Compliant;20 reps;2 seconds    Bridge with March  20  reps;2 seconds;Compliant      Modalities   Modalities  Electrical Stimulation;Moist Heat      Moist Heat Therapy   Number Minutes Moist Heat  15 Minutes    Moist Heat Location  Lumbar Spine      Electrical Stimulation   Electrical Stimulation Location  lumbar    Electrical Stimulation Action  IFC    Electrical Stimulation Parameters  supine    Electrical Stimulation Goals  Pain               PT Short Term Goals - 09/22/18 0841      PT SHORT TERM GOAL #1   Title  independent with initial HEP    Status  Achieved        PT Long Term Goals - 09/29/18 1749      PT LONG TERM GOAL #2   Title  understand proper posture and body mechanics    Time  8  Period  Weeks    Status  Partially Met            Plan - 09/29/18 1745    Clinical Impression Statement  Pt tolerated progression of exercises well today without adverse affects. Pt required mod VC to isolate core during bridge exercises and increased time due to interpreting. Pt required mod VC to fully extend knee during knee extensions. Pt demonstrated good body machanics during todays session. Pt is slowly making progress towards all therapy goals at this time.    Rehab Potential  Good    PT Frequency  1x / week    PT Duration  8 weeks    PT Treatment/Interventions  ADLs/Self Care Home Management;Cryotherapy;Electrical Stimulation;Moist Heat;Traction;Therapeutic exercise;Therapeutic activities;Neuromuscular re-education;Manual techniques;Patient/family education    PT Next Visit Plan  continue core stability, treat pain as needed    Consulted and Agree with Plan of Care  Patient       Patient will benefit from skilled therapeutic intervention in order to improve the following deficits and impairments:  Abnormal gait, Decreased range of motion, Difficulty walking, Increased muscle spasms, Decreased activity tolerance, Pain, Improper body mechanics, Impaired flexibility, Decreased strength, Postural  dysfunction  Visit Diagnosis: Acute midline low back pain without sciatica  Muscle spasm of back  Difficulty in walking, not elsewhere classified     Problem List Patient Active Problem List   Diagnosis Date Noted  . Age-related osteoporosis without current pathological fracture 02/12/2017  . Vitamin D deficiency 12/31/2016  . Vaginal atrophy 02/01/2014  . Psoriasis 10/12/2012  . HEMORRHOIDS, INTERNAL 12/06/2008  . RECTAL BLEEDING 12/06/2008  . COLONIC POLYPS 12/03/2008  . ESSENTIAL HYPERTENSION 12/03/2008  . HEMORRHOIDS, EXTERNAL 12/03/2008  . DIVERTICULOSIS, COLON 12/03/2008  . ARTHRITIS 12/03/2008  . NONSPEC ELEVATION OF LEVELS OF TRANSAMINASE/LDH 12/03/2008    Howell Rucks, SPTA 09/29/2018, 5:49 PM  [place] [address] [place], [place], [address] Phone: [phone]   Fax:  [phone]  Name: Mary Hunt MRN: [id number] Date of Birth: [birth year]

## 2018-10-12 ENCOUNTER — Encounter: Payer: Self-pay | Admitting: Physical Therapy

## 2018-10-12 ENCOUNTER — Ambulatory Visit: Payer: Medicare Other | Attending: Family Medicine | Admitting: Physical Therapy

## 2018-10-12 DIAGNOSIS — M545 Low back pain, unspecified: Secondary | ICD-10-CM

## 2018-10-12 DIAGNOSIS — M62830 Muscle spasm of back: Secondary | ICD-10-CM

## 2018-10-12 DIAGNOSIS — R262 Difficulty in walking, not elsewhere classified: Secondary | ICD-10-CM

## 2018-10-12 NOTE — Therapy (Signed)
[place], [place], [address] Phone: [phone]   Fax:  [phone]  Physical Therapy Treatment  Patient Details  Name: Mary Hunt MRN: [id number] Date of Birth: [birth year] Referring Provider (PT): Precious Haws   Encounter Date: 10/12/2018  PT End of Session - 10/12/18 1735    Visit Number  4    Date for PT Re-Evaluation  11/15/18    PT Start Time  1655    PT Stop Time  1750    PT Time Calculation (min)  55 min    Activity Tolerance  Patient tolerated treatment well    Behavior During Therapy  [facility] for tasks assessed/performed       Past Medical History:  Diagnosis Date  . Arthritis   . Colon polyps   . Depression   . Diabetes mellitus    TYPE 2  . Hypertension   . Osteoporosis 12/2016  . Vitamin D deficiency     Past Surgical History:  Procedure Laterality Date  . APPENDECTOMY    . BREAST SURGERY     BREAST BIOPSY-BENIGN    There were no vitals filed for this visit.  Subjective Assessment - 10/12/18 1659    Subjective  Patient reports that she is feeling better overall, still pain in the right low back    Currently in Pain?  Yes    Pain Score  6     Pain Location  Back    Pain Orientation  Right;Lower    Pain Descriptors / Indicators  Sore;Tightness    Aggravating Factors   worse in the morning    Pain Relieving Factors  back brace helps         [facility] PT Assessment - 10/12/18 0001      AROM   Overall AROM Comments  ROM WFL's but with some right low back pain                   [facility] Adult PT Treatment/Exercise - 10/12/18 0001      Lumbar Exercises: Stretches   Passive Hamstring Stretch  4 reps;20 seconds    Piriformis Stretch  4 reps;20 seconds      Lumbar Exercises: Aerobic   Nustep  level 4 x 6 minutes      Lumbar Exercises: Machines for Strengthening   Cybex Knee Extension  tried extension but this caused left knee pain    Cybex Knee Flexion  15#  2x10      Lumbar Exercises: Standing   Row  Both;20 reps;Theraband    Theraband Level (Row)  Level 2 (Red)    Shoulder Extension  Both;20 reps;Theraband    Theraband Level (Shoulder Extension)  Level 2 (Red)    Other Standing Lumbar Exercises  hip 3 way 2.5#      Lumbar Exercises: Supine   Bridge with Ball Squeeze  20 reps;2 seconds    Bridge with clamshell  Compliant;20 reps;2 seconds    Other Supine Lumbar Exercises  feet on ball K2C, trunk rotation, bridges and isometric abs      Modalities   Modalities  Electrical Stimulation;Moist Heat      Moist Heat Therapy   Number Minutes Moist Heat  15 Minutes    Moist Heat Location  Lumbar Spine      Electrical Stimulation   Electrical Stimulation Location  lumbar    Electrical Stimulation Action  IFC    Electrical Stimulation Parameters  supine  Electrical Stimulation Goals  Pain               PT Short Term Goals - 09/22/18 0841      PT SHORT TERM GOAL #1   Title  independent with initial HEP    Status  Achieved        PT Long Term Goals - 10/12/18 1737      PT LONG TERM GOAL #1   Title  decrease pain 50%    Status  On-going      PT LONG TERM GOAL #2   Title  understand proper posture and body mechanics    Status  Partially Met      PT LONG TERM GOAL #3   Title  increase lumbar ROM to WNL's    Status  Achieved      PT LONG TERM GOAL #4   Title  report that she can get up from sitting without struggling to walk    Status  Partially Met            Plan - 10/12/18 1735    Clinical Impression Statement  Patient with right knee pain on leg extnesion so we tried LAQ's with 2.5 # this still caused pain.  Other exercises she did well with, we progressed the feet on ball bridge to trying with arms across chest to decrease the stability and to have her use her abdominal mms.    PT Next Visit Plan  continue to work on core stability    Consulted and Agree with Plan of Care  Patient       Patient will  benefit from skilled therapeutic intervention in order to improve the following deficits and impairments:  Abnormal gait, Decreased range of motion, Difficulty walking, Increased muscle spasms, Decreased activity tolerance, Pain, Improper body mechanics, Impaired flexibility, Decreased strength, Postural dysfunction  Visit Diagnosis: Acute midline low back pain without sciatica  Muscle spasm of back  Difficulty in walking, not elsewhere classified     Problem List Patient Active Problem List   Diagnosis Date Noted  . Age-related osteoporosis without current pathological fracture 02/12/2017  . Vitamin D deficiency 12/31/2016  . Vaginal atrophy 02/01/2014  . Psoriasis 10/12/2012  . HEMORRHOIDS, INTERNAL 12/06/2008  . RECTAL BLEEDING 12/06/2008  . COLONIC POLYPS 12/03/2008  . ESSENTIAL HYPERTENSION 12/03/2008  . HEMORRHOIDS, EXTERNAL 12/03/2008  . DIVERTICULOSIS, COLON 12/03/2008  . ARTHRITIS 12/03/2008  . NONSPEC ELEVATION OF LEVELS OF TRANSAMINASE/LDH 12/03/2008    Sumner Boast., PT 10/12/2018, 5:38 PM  [place] [address] [place], [place], [address] Phone: [phone]   Fax:  [phone]  Name: Mary Hunt MRN: [id number] Date of Birth: Aug 02, 1941

## 2018-10-18 ENCOUNTER — Ambulatory Visit: Payer: Medicare Other | Admitting: Physical Therapy

## 2018-10-18 ENCOUNTER — Encounter: Payer: Self-pay | Admitting: Physical Therapy

## 2018-10-18 DIAGNOSIS — M545 Low back pain, unspecified: Secondary | ICD-10-CM

## 2018-10-18 DIAGNOSIS — R262 Difficulty in walking, not elsewhere classified: Secondary | ICD-10-CM

## 2018-10-18 DIAGNOSIS — M62830 Muscle spasm of back: Secondary | ICD-10-CM

## 2018-10-18 NOTE — Therapy (Signed)
[place], [place], [address] Phone: [phone]   Fax:  [phone]  Physical Therapy Treatment  Patient Details  Name: Mary Hunt MRN: [id number] Date of Birth: [birth year] Referring Provider (PT): Precious Haws   Encounter Date: 10/18/2018  PT End of Session - 10/18/18 1745    Visit Number  5    Date for PT Re-Evaluation  11/15/18    PT Start Time  1654    PT Stop Time  1742    PT Time Calculation (min)  48 min    Activity Tolerance  Patient tolerated treatment well    Behavior During Therapy  [facility] for tasks assessed/performed       Past Medical History:  Diagnosis Date  . Arthritis   . Colon polyps   . Depression   . Diabetes mellitus    TYPE 2  . Hypertension   . Osteoporosis 12/2016  . Vitamin D deficiency     Past Surgical History:  Procedure Laterality Date  . APPENDECTOMY    . BREAST SURGERY     BREAST BIOPSY-BENIGN    There were no vitals filed for this visit.  Subjective Assessment - 10/18/18 1658    Subjective  Reports overall doing well.  Pain worse in the AM    Currently in Pain?  Yes    Pain Score  3     Pain Location  Back    Pain Orientation  Lower;Right    Aggravating Factors   still worse in the morning                       [facility] Adult PT Treatment/Exercise - 10/18/18 0001      Lumbar Exercises: Stretches   Passive Hamstring Stretch  4 reps;20 seconds    Piriformis Stretch  4 reps;20 seconds      Lumbar Exercises: Aerobic   Nustep  level 4 x 6 minutes      Lumbar Exercises: Machines for Strengthening   Cybex Knee Flexion  15# 2x10      Lumbar Exercises: Standing   Row  Both;20 reps;Theraband    Theraband Level (Row)  Level 2 (Red)    Shoulder Extension  Both;20 reps;Theraband    Theraband Level (Shoulder Extension)  Level 2 (Red)    Other Standing Lumbar Exercises  hip 3 way 2.5#, 10# AR press needing a lot of cues for this      Lumbar Exercises: Supine   Bridge with Ball Squeeze  20 reps;2 seconds    Bridge with clamshell  Compliant;20 reps;2 seconds    Other Supine Lumbar Exercises  feet on ball K2C, trunk rotation, bridges and isometric abs, worked alot on the abs as she has a very hard time getting contraction wtthout raising her head      Modalities   Modalities  Electrical Stimulation;Moist Heat      Moist Heat Therapy   Number Minutes Moist Heat  15 Minutes    Moist Heat Location  Lumbar Spine      Electrical Stimulation   Electrical Stimulation Location  lumbar    Electrical Stimulation Action  IFC    Electrical Stimulation Parameters  supine    Electrical Stimulation Goals  Pain               PT Short Term Goals - 09/22/18 0841      PT SHORT TERM GOAL #1   Title  independent with initial HEP    Status  Achieved        PT Long Term Goals - 10/12/18 1737      PT LONG TERM GOAL #1   Title  decrease pain 50%    Status  On-going      PT LONG TERM GOAL #2   Title  understand proper posture and body mechanics    Status  Partially Met      PT LONG TERM GOAL #3   Title  increase lumbar ROM to WNL's    Status  Achieved      PT LONG TERM GOAL #4   Title  report that she can get up from sitting without struggling to walk    Status  Partially Met            Plan - 10/18/18 1747    Clinical Impression Statement  Patient doing well but really has a hard time activating her abdominal mms, can do it with raising her head but really canniot do it without this.  We tried many different ways verbal and tactile cues but unable    PT Next Visit Plan  really try to get some increase core activation    Consulted and Agree with Plan of Care  Patient       Patient will benefit from skilled therapeutic intervention in order to improve the following deficits and impairments:  Abnormal gait, Decreased range of motion, Difficulty walking, Increased muscle spasms, Decreased activity tolerance,  Pain, Improper body mechanics, Impaired flexibility, Decreased strength, Postural dysfunction  Visit Diagnosis: Acute midline low back pain without sciatica  Muscle spasm of back  Difficulty in walking, not elsewhere classified     Problem List Patient Active Problem List   Diagnosis Date Noted  . Age-related osteoporosis without current pathological fracture 02/12/2017  . Vitamin D deficiency 12/31/2016  . Vaginal atrophy 02/01/2014  . Psoriasis 10/12/2012  . HEMORRHOIDS, INTERNAL 12/06/2008  . RECTAL BLEEDING 12/06/2008  . COLONIC POLYPS 12/03/2008  . ESSENTIAL HYPERTENSION 12/03/2008  . HEMORRHOIDS, EXTERNAL 12/03/2008  . DIVERTICULOSIS, COLON 12/03/2008  . ARTHRITIS 12/03/2008  . NONSPEC ELEVATION OF LEVELS OF TRANSAMINASE/LDH 12/03/2008    Sumner Boast., PT 10/18/2018, 5:49 PM  [place] [address] [place], [place], [address] Phone: [phone]   Fax:  [phone]  Name: Mary Hunt MRN: [id number] Date of Birth: 1940/12/26

## 2018-10-20 ENCOUNTER — Encounter: Payer: Medicare Other | Admitting: Physical Therapy

## 2019-01-23 ENCOUNTER — Encounter: Payer: Medicare Other | Admitting: Gynecology

## 2019-01-24 ENCOUNTER — Ambulatory Visit (INDEPENDENT_AMBULATORY_CARE_PROVIDER_SITE_OTHER): Payer: Medicare Other | Admitting: Gynecology

## 2019-01-24 ENCOUNTER — Other Ambulatory Visit: Payer: Self-pay

## 2019-01-24 ENCOUNTER — Encounter: Payer: Self-pay | Admitting: Gynecology

## 2019-01-24 VITALS — BP 120/70 | Ht <= 58 in | Wt 128.0 lb

## 2019-01-24 DIAGNOSIS — Z01419 Encounter for gynecological examination (general) (routine) without abnormal findings: Secondary | ICD-10-CM | POA: Diagnosis not present

## 2019-01-24 DIAGNOSIS — L292 Pruritus vulvae: Secondary | ICD-10-CM

## 2019-01-24 DIAGNOSIS — M810 Age-related osteoporosis without current pathological fracture: Secondary | ICD-10-CM | POA: Diagnosis not present

## 2019-01-24 DIAGNOSIS — N952 Postmenopausal atrophic vaginitis: Secondary | ICD-10-CM

## 2019-01-24 MED ORDER — FLUCONAZOLE 150 MG PO TABS: 150.0000 mg | ORAL_TABLET | Freq: Once | ORAL | 0 refills | Status: AC

## 2019-01-24 NOTE — Progress Notes (Signed)
    Mary Hunt Aug 28, 1941 [id number]        77 y.o.  G3P3003 for breast and pelvic exam.  Complaining of intermittent occasional vaginal itching.  No discharge or odor.  No urinary symptoms.  Past medical history,surgical history, problem list, medications, allergies, family history and social history were all reviewed and documented as reviewed in the EPIC chart.  ROS:  Performed with pertinent positives and negatives included in the history, assessment and plan.   Additional significant findings : None   Exam: Caryn Bee assistant Vitals:   01/24/19 1358  BP: 120/70  Weight: 128 lb (58.1 kg)  Height: 4\' 9"  (1.448 m)   Body mass index is 27.7 kg/m.  General appearance:  Normal affect, orientation and appearance. Skin: Grossly normal HEENT: Without gross lesions.  No cervical or supraclavicular adenopathy. Thyroid normal.  Lungs:  Clear without wheezing, rales or rhonchi Cardiac: RR, without RMG Abdominal:  Soft, nontender, without masses, guarding, rebound, organomegaly or hernia Breasts:  Examined lying and sitting without masses, retractions, discharge or axillary adenopathy. Pelvic:  Ext, BUS, Vagina: With atrophic changes.  No discharge  Cervix: With atrophic changes  Uterus: Anteverted, normal size, shape and contour, midline and mobile nontender   Adnexa: Without masses or tenderness    Anus and perineum: Normal   Rectovaginal: Normal sphincter tone without palpated masses or tenderness.    Assessment/Plan:  78 y.o. G37P3003 female for breast and pelvic exam  1. Vaginal itching.  Exam was unremarkable showing atrophic changes but no discharge or inflammation.  Discussed with her granddaughter the differential to include atrophic changes versus low-grade yeast.  I am going to arbitrarily treat her with Diflucan 150 mg x 1 dose.  If her symptoms would persist recommend OTC vaginal moisturizers.  Discussed possible vaginal estrogen if continues. 2. Osteoporosis.  DEXA  2018 T score -2.6.  Starting third year of Reclast.  Schedule follow-up DEXA now at 2-year interval.  Continue with Reclast. 3. Mammography overdue and recommended patient schedule.  Breast exam normal today. 4. Colonoscopy 2015.  Repeat scheduled end of this week. 5. Pap smear 2012.  No Pap smear done today.  No history of abnormal Pap smears.  Per current screening guidelines we will stop screening. 6. Health maintenance.  No routine lab work done.  Follow-up 1 year, sooner as needed.   Anastasio Auerbach MD, 2:27 PM 01/24/2019

## 2019-01-24 NOTE — Patient Instructions (Signed)
Follow-up for the bone density as scheduled.  The office will call to arrange for the Reclast infusion for the bones.

## 2019-03-27 ENCOUNTER — Other Ambulatory Visit: Payer: Self-pay

## 2019-03-27 ENCOUNTER — Other Ambulatory Visit: Payer: Medicare Other

## 2019-03-27 ENCOUNTER — Telehealth: Payer: Self-pay | Admitting: *Deleted

## 2019-03-27 DIAGNOSIS — M810 Age-related osteoporosis without current pathological fracture: Secondary | ICD-10-CM

## 2019-03-27 NOTE — Telephone Encounter (Addendum)
Recast instructions  Annual Exam (1 year) ? 01/24/2019 TF  Called pt to verify insurance coverage / inform pt Reclast is in Process ?  Labs must be in 30 days window  Serum Creatinine DATE? appt 03/28/2019 Serum Calcium DATE? appt 03/28/2019   PA needed? NIO  Cost for Pt? $0 pt has medicare and medicaid  Order form filled out and faxed  w/MD sig?  Infusion will be done at ? [facility]  Pt aware?  Instructions mailed to pt?

## 2019-03-28 LAB — CALCIUM: Calcium: 10.2 mg/dL (ref 8.6–10.4)

## 2019-03-28 LAB — CREATININE, SERUM: Creat: 0.75 mg/dL (ref 0.60–0.93)

## 2019-04-05 ENCOUNTER — Other Ambulatory Visit (HOSPITAL_COMMUNITY): Payer: Self-pay | Admitting: *Deleted

## 2019-04-06 ENCOUNTER — Other Ambulatory Visit: Payer: Self-pay

## 2019-04-06 ENCOUNTER — Ambulatory Visit (HOSPITAL_COMMUNITY)
Admission: RE | Admit: 2019-04-06 | Discharge: 2019-04-06 | Disposition: A | Discharge status: Home / Self Care | Payer: Medicare Other | Source: Ambulatory Visit | Attending: Gynecology | Admitting: Gynecology

## 2019-04-06 DIAGNOSIS — M810 Age-related osteoporosis without current pathological fracture: Secondary | ICD-10-CM | POA: Insufficient documentation

## 2019-04-06 MED ORDER — ZOLEDRONIC ACID 5 MG/100ML IV SOLN
INTRAVENOUS | Status: AC
  Administered 2019-04-06: 5 mg via INTRAVENOUS
  Filled 2019-04-06: qty 100

## 2019-04-06 MED ORDER — ZOLEDRONIC ACID 5 MG/100ML IV SOLN
5.0000 mg | Freq: Once | INTRAVENOUS | Status: AC
  Administered 2019-04-06: 11:00:00 5 mg via INTRAVENOUS

## 2019-05-10 NOTE — Telephone Encounter (Signed)
Relcast given 04/06/2019 Next IV 04/07/2019

## 2019-05-15 ENCOUNTER — Other Ambulatory Visit: Payer: Self-pay

## 2019-05-16 ENCOUNTER — Other Ambulatory Visit: Payer: Self-pay | Admitting: Obstetrics & Gynecology

## 2019-05-16 ENCOUNTER — Other Ambulatory Visit: Payer: Self-pay | Admitting: Gynecology

## 2019-05-16 ENCOUNTER — Telehealth: Payer: Self-pay | Admitting: *Deleted

## 2019-05-16 ENCOUNTER — Encounter (INDEPENDENT_AMBULATORY_CARE_PROVIDER_SITE_OTHER): Payer: Medicare Other

## 2019-05-16 DIAGNOSIS — Z780 Asymptomatic menopausal state: Secondary | ICD-10-CM | POA: Diagnosis not present

## 2019-05-16 DIAGNOSIS — M818 Other osteoporosis without current pathological fracture: Secondary | ICD-10-CM

## 2019-05-16 DIAGNOSIS — M8589 Other specified disorders of bone density and structure, multiple sites: Secondary | ICD-10-CM | POA: Diagnosis not present

## 2019-05-16 DIAGNOSIS — M8580 Other specified disorders of bone density and structure, unspecified site: Secondary | ICD-10-CM

## 2019-05-16 NOTE — Telephone Encounter (Signed)
Patient checked out with Butch Penny and told her the office was suppose to schedule a colonscopy for her. Per note on 01/24/19 she has this done at [facility] called and left detailed message that she can call and schedule this [facility] # provided [phone]

## 2019-05-17 ENCOUNTER — Encounter: Payer: Self-pay | Admitting: Gynecology

## 2019-06-02 ENCOUNTER — Other Ambulatory Visit (HOSPITAL_COMMUNITY): Payer: Self-pay

## 2019-06-02 DIAGNOSIS — R1310 Dysphagia, unspecified: Secondary | ICD-10-CM

## 2019-06-21 ENCOUNTER — Encounter (HOSPITAL_COMMUNITY): Payer: Medicare Other

## 2019-06-30 ENCOUNTER — Ambulatory Visit (HOSPITAL_COMMUNITY)
Admission: RE | Admit: 2019-06-30 | Discharge: 2019-06-30 | Disposition: A | Discharge status: Home / Self Care | Payer: Medicare Other | Source: Ambulatory Visit | Attending: Family Medicine | Admitting: Family Medicine

## 2019-06-30 ENCOUNTER — Other Ambulatory Visit: Payer: Self-pay

## 2019-06-30 DIAGNOSIS — R1310 Dysphagia, unspecified: Secondary | ICD-10-CM | POA: Insufficient documentation

## 2019-06-30 NOTE — Progress Notes (Signed)
Modified Barium Swallow Progress Note  Patient Details  Name: Mary Hunt MRN: [id number] Date of Birth: 1941/04/29  Today's Date: 06/30/2019  Modified Barium Swallow completed.  Full report located under Chart Review in the Imaging Section.  Brief recommendations include the following:  Clinical Impression  Pt demonstrates normal oral and oropharyngeal function. Esophageal sweep showed concern for primary esophageal dysphagia. F/u with GI or dedicated esophageal imaging (Barium swallow/DG Esophagus) per MD if warranted. Suggested very basic esophageal strategies to pt. No SLP f/u needed.    Swallow Evaluation Recommendations   Recommended Consults: Consider GI evaluation;Consider esophageal assessment   SLP Diet Recommendations: Regular solids;Thin liquid   Liquid Administration via: Cup;Straw   Medication Administration: Whole meds with liquid   Supervision: Patient able to self feed   Compensations: Slow rate;Small sips/bites;Follow solids with liquid   Postural Changes: Remain semi-upright after after feeds/meals (Comment);Seated upright at 90 degrees   Oral Care Recommendations: Oral care BID       Herbie Baltimore, MA CCC-SLP  Acute Rehabilitation Services Pager [phone] Office [phone]  Lynann Beaver 06/30/2019,11:33 AM

## 2019-07-07 ENCOUNTER — Ambulatory Visit (INDEPENDENT_AMBULATORY_CARE_PROVIDER_SITE_OTHER): Payer: Medicare Other | Admitting: Gastroenterology

## 2019-07-07 ENCOUNTER — Encounter: Payer: Self-pay | Admitting: Gastroenterology

## 2019-07-07 ENCOUNTER — Other Ambulatory Visit (INDEPENDENT_AMBULATORY_CARE_PROVIDER_SITE_OTHER): Payer: Medicare Other

## 2019-07-07 VITALS — BP 130/70 | HR 77 | Temp 98.1°F | Ht <= 58 in | Wt 125.0 lb

## 2019-07-07 DIAGNOSIS — K219 Gastro-esophageal reflux disease without esophagitis: Secondary | ICD-10-CM | POA: Diagnosis not present

## 2019-07-07 DIAGNOSIS — Z86010 Personal history of colonic polyps: Secondary | ICD-10-CM

## 2019-07-07 DIAGNOSIS — R7989 Other specified abnormal findings of blood chemistry: Secondary | ICD-10-CM

## 2019-07-07 DIAGNOSIS — R634 Abnormal weight loss: Secondary | ICD-10-CM

## 2019-07-07 DIAGNOSIS — R945 Abnormal results of liver function studies: Secondary | ICD-10-CM

## 2019-07-07 DIAGNOSIS — R0789 Other chest pain: Secondary | ICD-10-CM

## 2019-07-07 DIAGNOSIS — R1310 Dysphagia, unspecified: Secondary | ICD-10-CM | POA: Insufficient documentation

## 2019-07-07 DIAGNOSIS — R1319 Other dysphagia: Secondary | ICD-10-CM | POA: Insufficient documentation

## 2019-07-07 LAB — CBC
HCT: 40.7 % (ref 36.0–46.0)
Hemoglobin: 13.4 g/dL (ref 12.0–15.0)
MCHC: 32.9 g/dL (ref 30.0–36.0)
MCV: 88.2 fl (ref 78.0–100.0)
Platelets: 232 10*3/uL (ref 150.0–400.0)
RBC: 4.62 Mil/uL (ref 3.87–5.11)
RDW: 13.9 % (ref 11.5–15.5)
WBC: 7.6 10*3/uL (ref 4.0–10.5)

## 2019-07-07 MED ORDER — OMEPRAZOLE 40 MG PO CPDR: 40.0000 mg | DELAYED_RELEASE_CAPSULE | Freq: Every day | ORAL | 3 refills | Status: DC

## 2019-07-07 MED ORDER — PEG 3350-KCL-NA BICARB-NACL 420 G PO SOLR: 4000.0000 mL | Freq: Once | ORAL | 0 refills | Status: AC

## 2019-07-07 NOTE — Patient Instructions (Signed)
Your provider has requested that you go to the basement level for lab work before leaving today. Press "B" on the elevator. The lab is located at the first door on the left as you exit the elevator.  You have been scheduled for an endoscopy and colonoscopy. Please follow the written instructions given to you at your visit today. Please pick up your prep supplies at the pharmacy within the next 1-3 days. If you use inhalers (even only as needed), please bring them with you on the day of your procedure.  We have sent the following medications to your pharmacy for you to pick up at your convenience: Omeprazole   You have been scheduled for a Barium Esophogram at [facility] (1st floor of the hospital) on 07/13/2019 at 11:00am. Please arrive 15 minutes prior to your appointment for registration. Make certain not to have anything to eat or drink 3 hours prior to your test. If you need to reschedule for any reason, please contact radiology at [phone] to do so. __________________________________________________________________ A barium swallow is an examination that concentrates on views of the esophagus. This tends to be a double contrast exam (barium and two liquids which, when combined, create a gas to distend the wall of the oesophagus) or single contrast (non-ionic iodine based). The study is usually tailored to your symptoms so a good history is essential. Attention is paid during the study to the form, structure and configuration of the esophagus, looking for functional disorders (such as aspiration, dysphagia, achalasia, motility and reflux) EXAMINATION You may be asked to change into a gown, depending on the type of swallow being performed. A radiologist and radiographer will perform the procedure. The radiologist will advise you of the type of contrast selected for your procedure and direct you during the exam. You will be asked to stand, sit or lie in several different positions and to  hold a small amount of fluid in your mouth before being asked to swallow while the imaging is performed .In some instances you may be asked to swallow barium coated marshmallows to assess the motility of a solid food bolus. The exam can be recorded as a digital or video fluoroscopy procedure. POST PROCEDURE It will take 1-2 days for the barium to pass through your system. To facilitate this, it is important, unless otherwise directed, to increase your fluids for the next 24-48hrs and to resume your normal diet.  This test typically takes about 30 minutes to perform. __________________________________________________________________________________   Thank you for choosing me and [facility].  Dr. Rush Landmark

## 2019-07-07 NOTE — Progress Notes (Signed)
[facility] VISIT   Primary Care Provider Bartholome Bill, MD [place] [address] [phone]  Referring Provider Bartholome Bill, MD [address] [place],  [place] [address] [phone]  Patient Profile: Mary Hunt is a 78 y.o. female with a pmh significant for diabetes, hypertension, arthritis, colon polyps, hemorrhoids, vitamin D deficiency, osteoporosis, abnormal liver test (query secondary to fatty liver).  The patient presents to the [facility] for an evaluation and management of problem(s) noted below:  Problem List 1. Esophageal dysphagia   2. Atypical chest pain   3. Gastroesophageal reflux disease, esophagitis presence not specified   4. Unintentional weight loss   5. History of colonic polyps   6. History of Abnormal LFTs     History of Present Illness This is the patient's first visit to the outpatient [facility] clinic in years.  The patient was last seen by Dr. Sharlett Iles in 2015 for a colonoscopy was found to have adenomatous polyps with a history of prior adenomatous polyps.  The patient is accompanied today by her granddaughter and we have interpreter services as well.  Over the course of the last year the patient has began to experience a sensation and chest pain that occurs when she eats solid foods this happens nearly every time that she begins eating a solid food where she will feel food get caught up near the sternal notch hold for a bit and subsequently she will have to regurgitate that first by after that first bite she can then slowly chew her food and passed things thereafter.  Thus, she does not regurgitate that frequently after the initial.  Of regurgitation.  She describes having acid reflux symptoms and pyrosis for the last few months.  She was started on over-the-counter omeprazole 20 mg with possible slight improvement in symptoms although granddaughter  is not completely sure that it has significantly impacted how often things are occurring.  She states that over the course of the last 1 to 2 months she has lost 15 pounds in the course of the last year has lost nearly 15 pounds unintentionally.  She denies any dyspnea on exertion or chest pain at rest.  There is a family history of her mother having gastric cancer in her 76s.  Her ex-husband passed away from cancer of the upper GI tract.  This is her concern.  She does not have any odynophagia.  She denies abdominal pain or early satiety.  Liquids passed without problems.  She denies any changes in her bowel habits.  She has one formed bowel movement on a daily basis that is brown.  She has never had an upper endoscopy.  GI Review of Systems Positive as above Negative for globus, nausea, vomiting, melena, hematochezia  Review of Systems General: Denies fevers/chills HEENT: Denies oral lesions Cardiovascular: Denies current chest pain/palpitations Pulmonary: Denies shortness of breath/nocturnal cough Gastroenterological: See HPI Genitourinary: Denies darkened urine Hematological: Denies easy bruising/bleeding Endocrine: Denies temperature intolerance Dermatological: Denies jaundice Psychological: Mood is anxious to feel better   Medications Current Outpatient Medications  Medication Sig Dispense Refill   aspirin EC 81 MG tablet Take 81 mg by mouth daily.     CALCIUM-VITAMIN D PO Take 600 mg by mouth daily. D3     diclofenac sodium (VOLTAREN) 1 % GEL Apply 2 g topically as directed.     ibuprofen (ADVIL,MOTRIN) 600 MG tablet Take 1 tablet (600 mg total) by  mouth every 8 (eight) hours as needed for pain. 60 tablet 1   Lancets (ONETOUCH ULTRASOFT) lancets   0   Lancets MISC by Does not apply route.     lisinopril-hydrochlorothiazide (PRINZIDE,ZESTORETIC) 10-12.5 MG tablet Take by mouth.     loratadine (CLARITIN) 10 MG tablet Take 1 tablet by mouth daily.     loteprednol (ALREX)  0.2 % SUSP Administer 1 drop to both eyes daily.     metFORMIN (GLUMETZA) 500 MG (MOD) 24 hr tablet Take 500 mg by mouth daily with breakfast.       ONE TOUCH ULTRA TEST test strip   0   oxybutynin (DITROPAN-XL) 5 MG 24 hr tablet Take 1 tablet by mouth daily.     omeprazole (PRILOSEC) 40 MG capsule Take 1 capsule (40 mg total) by mouth daily. 30 capsule 3   No current facility-administered medications for this visit.     Allergies Allergies  Allergen Reactions   Statins     Elevated liver enzymes   Penicillins Rash    REACTION: itching    Histories Past Medical History:  Diagnosis Date   Arthritis    Colon polyps    Depression    Diabetes mellitus    TYPE 2   Hypertension    Osteoporosis 05/2019   T score -2.3.  Prior DEXA T score -2.6   Vitamin D deficiency    Past Surgical History:  Procedure Laterality Date   APPENDECTOMY     BREAST SURGERY     BREAST BIOPSY-BENIGN   Social History   Socioeconomic History   Marital status: Single    Spouse name: Not on file   Number of children: Not on file   Years of education: Not on file   Highest education level: Not on file  Occupational History   Not on file  Social Needs   Financial resource strain: Not on file   Food insecurity    Worry: Not on file    Inability: Not on file   Transportation needs    Medical: Not on file    Non-medical: Not on file  Tobacco Use   Smoking status: Never Smoker   Smokeless tobacco: Never Used  Substance and Sexual Activity   Alcohol use: No   Drug use: No   Sexual activity: Not Currently    Comment: intercourse age 40, less than 5 sexual partners,des neg  Lifestyle   Physical activity    Days per week: Not on file    Minutes per session: Not on file   Stress: Not on file  Relationships   Social connections    Talks on phone: Not on file    Gets together: Not on file    Attends religious service: Not on file    Active member of club or  organization: Not on file    Attends meetings of clubs or organizations: Not on file    Relationship status: Not on file   Intimate partner violence    Fear of current or ex partner: Not on file    Emotionally abused: Not on file    Physically abused: Not on file    Forced sexual activity: Not on file  Other Topics Concern   Not on file  Social History Narrative   Not on file   Family History  Problem Relation Age of Onset   Cancer Mother        stomach   Stomach cancer Mother    Breast cancer Sister  Breast cancer Maternal Aunt    Colon cancer Neg Hx    Esophageal cancer Neg Hx    Inflammatory bowel disease Neg Hx    Liver disease Neg Hx    Pancreatic cancer Neg Hx    Rectal cancer Neg Hx    I have reviewed her medical, social, and family history in detail and updated the electronic medical record as necessary.    PHYSICAL EXAMINATION  BP 130/70    Pulse 77    Temp 98.1 F (36.7 C)    Ht 4\' 9"  (1.448 m)    Wt 125 lb (56.7 kg)    LMP 06/09/1991    BMI 27.05 kg/m  Wt Readings from Last 3 Encounters:  07/07/19 125 lb (56.7 kg)  04/06/19 126 lb (57.2 kg)  01/24/19 128 lb (58.1 kg)  GEN: NAD, appears stated age, doesn't appear chronically ill, accompanied by granddaughter PSYCH: Cooperative, without pressured speech EYE: Conjunctivae pink, sclerae anicteric ENT: MMM, without oral ulcers, no erythema or exudates noted NECK: Supple CV: RR without R/Gs  RESP: CTAB posteriorly, without wheezing GI: NABS, soft, NT/ND, without rebound or guarding, no HSM appreciated  MSK/EXT: No lower extremity edema SKIN: No jaundice NEURO:  Alert & Oriented x 3, no focal deficits   REVIEW OF DATA  I reviewed the following data at the time of this encounter:  GI Procedures and Studies  2015 colonoscopy Mild diverticulosis in the descending and sigmoid colon. 2 sessile polyps ranging between 3 and 5 mm in size were found in the sigmoid colon and these were removed with  biopsy forceps. Pathology returned as adenomatous tissue.  Laboratory Studies  Reviewed those in epic  Imaging Studies  2010 abdominal ultrasound IMPRESSION: Findings compatible with diffuse fatty infiltration of the liver. A suggestive of vascular wall calcification of intrasplenic vessels. Otherwise unremarkable abdominal ultrasound.   ASSESSMENT  Ms. Odom is a 78 y.o. female with a pmh significant for diabetes, hypertension, arthritis, colon polyps, hemorrhoids, vitamin D deficiency, osteoporosis, abnormal liver test (query secondary to fatty liver).  The patient is seen today for evaluation and management of:  1. Esophageal dysphagia   2. Atypical chest pain   3. Gastroesophageal reflux disease, esophagitis presence not specified   4. Unintentional weight loss   5. History of colonic polyps   6. History of Abnormal LFTs    The patient is hemodynamically stable.  She presents with a clinical history that is concerning for esophageal dysphagia.  She will require an upper endoscopic evaluation.  A barium esophagram will be helpful for [place] to delineate how things are.  She also has a history of colon polyps and is due for colon cancer screening colon polyp surveillance.  We will plan to do both an upper and lower endoscopy on her.  Biopsies will be obtained from the esophagus as well as stomach and colon polyps will be removed as they are found.  Dilation will be performed if no overt ring is found or if there is evidence of a ring.  Anticipate manometry if work-up above is unremarkable.  Will obtain a blood count as we have not had a CBC in quite a few years.  We will plan to increase PPI to 40 mg daily to see how she does as she has had some minimal improvement while being on once daily over-the-counter omeprazole.  The risks and benefits of endoscopic evaluation were discussed with the patient; these include but are not limited to the risk of perforation,  infection, bleeding, missed  lesions, lack of diagnosis, severe illness requiring hospitalization, as well as anesthesia and sedation related illnesses.  The patient and family are agreeable to proceed.  All patient and family questions were answered, to the best of my ability, and the patient agrees to the aforementioned plan of action with follow-up as indicated.   PLAN  Omeprazole 40 mg daily Laboratories as outlined below Esophagram to be performed before endoscopy EGD/colonoscopy to be scheduled (esophageal/gastric biopsies to be obtained and dilation)   Orders Placed This Encounter  Procedures   DG ESOPHAGUS W DOUBLE CM (HD)   CBC   Ambulatory referral to Gastroenterology    New Prescriptions   OMEPRAZOLE (PRILOSEC) 40 MG CAPSULE    Take 1 capsule (40 mg total) by mouth daily.   Modified Medications   No medications on file    Planned Follow Up No follow-ups on file.   Justice Britain, MD [facility]oscopy Office # [phone]

## 2019-07-13 ENCOUNTER — Other Ambulatory Visit: Payer: Self-pay

## 2019-07-13 ENCOUNTER — Encounter: Payer: Self-pay | Admitting: Gastroenterology

## 2019-07-13 ENCOUNTER — Ambulatory Visit (HOSPITAL_COMMUNITY)
Admission: RE | Admit: 2019-07-13 | Discharge: 2019-07-13 | Disposition: A | Discharge status: Home / Self Care | Payer: Medicare Other | Source: Ambulatory Visit | Attending: Gastroenterology | Admitting: Gastroenterology

## 2019-07-13 DIAGNOSIS — R945 Abnormal results of liver function studies: Secondary | ICD-10-CM | POA: Diagnosis present

## 2019-07-13 DIAGNOSIS — R634 Abnormal weight loss: Secondary | ICD-10-CM | POA: Diagnosis present

## 2019-07-13 DIAGNOSIS — R0789 Other chest pain: Secondary | ICD-10-CM | POA: Insufficient documentation

## 2019-07-13 DIAGNOSIS — R1310 Dysphagia, unspecified: Secondary | ICD-10-CM

## 2019-07-13 DIAGNOSIS — R7989 Other specified abnormal findings of blood chemistry: Secondary | ICD-10-CM

## 2019-07-13 DIAGNOSIS — R1319 Other dysphagia: Secondary | ICD-10-CM

## 2019-07-14 ENCOUNTER — Telehealth: Payer: Self-pay | Admitting: Gastroenterology

## 2019-07-19 ENCOUNTER — Telehealth: Payer: Self-pay

## 2019-07-19 NOTE — Telephone Encounter (Signed)
Covid-19 screening questions   Do you now or have you had a fever in the last 14 days?  Do you have any respiratory symptoms of shortness of breath or cough now or in the last 14 days?  Do you have any family members or close contacts with diagnosed or suspected Covid-19 in the past 14 days?  Have you been tested for Covid-19 and found to be positive?       

## 2019-07-20 ENCOUNTER — Other Ambulatory Visit: Payer: Self-pay

## 2019-07-20 ENCOUNTER — Encounter: Payer: Self-pay | Admitting: Gastroenterology

## 2019-07-20 ENCOUNTER — Ambulatory Visit (AMBULATORY_SURGERY_CENTER): Payer: Medicare Other | Admitting: Gastroenterology

## 2019-07-20 VITALS — BP 141/70 | HR 77 | Temp 98.3°F | Resp 15 | Ht <= 58 in | Wt 125.0 lb

## 2019-07-20 DIAGNOSIS — Z86010 Personal history of colonic polyps: Secondary | ICD-10-CM | POA: Diagnosis not present

## 2019-07-20 DIAGNOSIS — D122 Benign neoplasm of ascending colon: Secondary | ICD-10-CM

## 2019-07-20 DIAGNOSIS — D120 Benign neoplasm of cecum: Secondary | ICD-10-CM

## 2019-07-20 DIAGNOSIS — R1310 Dysphagia, unspecified: Secondary | ICD-10-CM

## 2019-07-20 DIAGNOSIS — K449 Diaphragmatic hernia without obstruction or gangrene: Secondary | ICD-10-CM | POA: Diagnosis not present

## 2019-07-20 DIAGNOSIS — D127 Benign neoplasm of rectosigmoid junction: Secondary | ICD-10-CM

## 2019-07-20 DIAGNOSIS — R1319 Other dysphagia: Secondary | ICD-10-CM

## 2019-07-20 DIAGNOSIS — K219 Gastro-esophageal reflux disease without esophagitis: Secondary | ICD-10-CM

## 2019-07-20 DIAGNOSIS — D123 Benign neoplasm of transverse colon: Secondary | ICD-10-CM

## 2019-07-20 DIAGNOSIS — K222 Esophageal obstruction: Secondary | ICD-10-CM | POA: Diagnosis not present

## 2019-07-20 MED ORDER — SODIUM CHLORIDE 0.9 % IV SOLN: 500.0000 mL | Freq: Once | INTRAVENOUS | Status: DC

## 2019-07-20 NOTE — Op Note (Signed)
[place] Patient Name: Mary Hunt Procedure Date: 07/20/2019 11:11 AM MRN: [id number] Endoscopist: Justice Britain , MD Age: 78 Referring MD:  Date of Birth: 06/24/1941 Gender: Female Account #: [id number] Procedure:                Colonoscopy Indications:              Surveillance: Personal history of adenomatous                            polyps on last colonoscopy > 5 years ago Medicines:                Monitored Anesthesia Care Procedure:                Pre-Anesthesia Assessment:                           - Prior to the procedure, a History and Physical                            was performed, and patient medications and                            allergies were reviewed. The patient's tolerance of                            previous anesthesia was also reviewed. The risks                            and benefits of the procedure and the sedation                            options and risks were discussed with the patient.                            All questions were answered, and informed consent                            was obtained. Prior Anticoagulants: The patient has                            taken no previous anticoagulant or antiplatelet                            agents except for aspirin. ASA Grade Assessment:                            III - A patient with severe systemic disease. After                            reviewing the risks and benefits, the patient was                            deemed in satisfactory condition to undergo the  procedure.                           After obtaining informed consent, the colonoscope                            was passed under direct vision. Throughout the                            procedure, the patient's blood pressure, pulse, and                            oxygen saturations were monitored continuously. The                            Colonoscope was introduced through the anus and                             advanced to the 5 cm into the ileum. The                            colonoscopy was performed without difficulty. The                            patient tolerated the procedure. The quality of the                            bowel preparation was good. The terminal ileum,                            ileocecal valve, appendiceal orifice, and rectum                            were photographed. Scope In: 11:48:29 AM Scope Out: 12:06:03 PM Scope Withdrawal Time: 0 hours 15 minutes 7 seconds  Total Procedure Duration: 0 hours 17 minutes 34 seconds  Findings:                 The digital rectal exam findings include                            hemorrhoids. Pertinent negatives include no                            palpable rectal lesions.                           The terminal ileum and ileocecal valve appeared                            normal.                           Seven sessile polyps were found in the  recto-sigmoid colon (1), transverse colon (2),                            ascending colon (2) and cecum (2). The polyps were                            2 to 8 mm in size. These polyps were removed with a                            cold snare. Resection and retrieval were complete.                           Multiple small-mouthed diverticula were found in                            the entire colon.                           Normal mucosa was found in the entire colon                            otherwise.                           Non-bleeding non-thrombosed internal hemorrhoids                            were found during retroflexion, during perianal                            exam and during digital exam. The hemorrhoids were                            Grade II (internal hemorrhoids that prolapse but                            reduce spontaneously). Complications:            No immediate complications. Estimated Blood Loss:      Estimated blood loss was minimal. Impression:               - Hemorrhoids found on digital rectal exam.                           - The examined portion of the ileum was normal.                           - Seven, 2 to 8 mm polyps at the recto-sigmoid                            colon, in the transverse colon, in the ascending                            colon and in the cecum, removed with a cold snare.  Resected and retrieved.                           - Diverticulosis in the entire examined colon.                           - Normal mucosa in the entire examined colon                            otherwise.                           - Non-bleeding non-thrombosed internal hemorrhoids. Recommendation:           - The patient will be observed post-procedure,                            until all discharge criteria are met.                           - Discharge patient to home.                           - Patient has a contact number available for                            emergencies. The signs and symptoms of potential                            delayed complications were discussed with the                            patient. Return to normal activities tomorrow.                            Written discharge instructions were provided to the                            patient.                           - High fiber diet.                           - Use FiberCon 1 tablet PO daily.                           - Continue present medications.                           - Await pathology results.                           - Repeat colonoscopy in 3 years for surveillance if                            patient's life-expectany is felt to be greater than  10-years, otherwise this could be considered                            patient's last colonoscopy (can have PCP and GI                            discussion at that time).                           -  The findings and recommendations were discussed                            with the patient.                           - The findings and recommendations were discussed                            with the patient's family. Justice Britain, MD 07/20/2019 12:34:23 PM

## 2019-07-20 NOTE — Progress Notes (Signed)
Called to room to assist during endoscopic procedure.  Patient ID and intended procedure confirmed with present staff. Received instructions for my participation in the procedure from the performing physician.  

## 2019-07-20 NOTE — Op Note (Addendum)
[place] Patient Name: Mary Hunt Procedure Date: 07/20/2019 11:12 AM MRN: [id number] Endoscopist: Justice Britain , MD Age: 78 Referring MD:  Date of Birth: 1941-02-25 Gender: Female Account #: [id number] Procedure:                Upper GI endoscopy Indications:              Epigastric abdominal pain, Dysphagia, Heartburn,                            Suspected gastro-esophageal reflux disease,                            Abnormal cine-esophagram Medicines:                Monitored Anesthesia Care Procedure:                Pre-Anesthesia Assessment:                           - Prior to the procedure, a History and Physical                            was performed, and patient medications and                            allergies were reviewed. The patient's tolerance of                            previous anesthesia was also reviewed. The risks                            and benefits of the procedure and the sedation                            options and risks were discussed with the patient.                            All questions were answered, and informed consent                            was obtained. Prior Anticoagulants: The patient has                            taken no previous anticoagulant or antiplatelet                            agents except for aspirin. ASA Grade Assessment:                            III - A patient with severe systemic disease. After                            reviewing the risks and benefits, the patient was  deemed in satisfactory condition to undergo the                            procedure.                           After obtaining informed consent, the endoscope was                            passed under direct vision. Throughout the                            procedure, the patient's blood pressure, pulse, and                            oxygen saturations were monitored continuously. The                 Endoscope was introduced through the mouth, and                            advanced to the second part of duodenum. The upper                            GI endoscopy was accomplished without difficulty.                            The patient tolerated the procedure. Scope In: Scope Out: Findings:                 No gross lesions were noted in the entire                            esophagus. Biopsies were taken with a cold forceps                            for histology to rule out EoE and LoE.                           The examined esophagus was grossly tortuous.                           A non-obstructing and mild Schatzki ring was found                            at the gastroesophageal junction. Biopsies were                            taken with a cold forceps for histology to disrupt                            it and then a TTS dilator was passed through the                            scope. Dilation with an 09-20-12 mm balloon, a  13.5-14.5-15.5 mm balloon and a 16-17-18 mm balloon                            dilator was performed to 18 mm.                           A large hiatal hernia was found. The proximal                            extent of the gastric folds (end of tubular                            esophagus) was 28 cm from the incisors. The hiatal                            narrowing was 33 cm from the incisors. The Z-line                            was 27 cm from the incisors.                           Patchy mildly erythematous mucosa without bleeding                            was found in the gastric antrum. Biopsies were                            taken with a cold forceps for histology and                            Helicobacter pylori testing.                           No gross lesions were noted in the duodenal bulb,                            in the first portion of the duodenum and in the                            second  portion of the duodenum. Biopsies were taken                            with a cold forceps for histology to rule out                            enteropathy. Complications:            No immediate complications. Estimated Blood Loss:     Estimated blood loss was minimal. Impression:               - No gross lesions in esophagus. Biopsied for EoE.                           - Tortuous esophagus.                           -  Non-obstructing and mild Schatzki ring. Biopsied.                            Dilated.                           - Large hiatal hernia.                           - Erythematous mucosa in the antrum. Biopsied.                           - No gross lesions in the duodenal bulb, in the                            first portion of the duodenum and in the second                            portion of the duodenum. Biopsied. Recommendation:           - Proceed to scheduled colonoscopy.                           - Observe patient's clinical course.                           - Continue present medications. PPI QD for now but                            consider increasing to BID.                           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                           - The findings and recommendations were discussed                            with the patient.                           - The findings and recommendations were discussed                            with the patient's family. Justice Britain, MD 07/20/2019 12:29:58 PM

## 2019-07-20 NOTE — Progress Notes (Signed)
Per Dr. Rush Landmark- pt is ok to be on Prilosec 40 mg daily- not BID.  She says her throat is "burning"- MD aware and gave the granddaughter some recommendations

## 2019-07-20 NOTE — Patient Instructions (Signed)
USTED TUVO UN PROCEDIMIENTO ENDOSCPICO HOY EN EL [facility]:   Lea el informe del procedimiento que se le entreg para cualquier pregunta especfica sobre lo que se Primary school teacher.  Si el informe del examen no responde a sus preguntas, por favor llame a su gastroenterlogo para aclararlo.  Si usted solicit que no se le den [employer] de lo que se Estate manager/land agent en su procedimiento al [employer] va a cuidar, entonces el informe del procedimiento se ha incluido en un sobre sellado para que usted lo revise despus cuando le sea ms conveniente.   LO QUE PUEDE ESPERAR: Algunas sensaciones de hinchazn en el abdomen.  Puede tener ms gases de lo normal.  El caminar puede ayudarle a eliminar el aire que se le puso en el tracto gastrointestinal durante el procedimiento y reducir la hinchazn.  Si le hicieron una endoscopia inferior (como una colonoscopia o una sigmoidoscopia flexible), podra notar manchas de sangre en las heces fecales o en el papel higinico.  Si se someti a una preparacin intestinal para su procedimiento, es posible que no tenga una evacuacin intestinal normal durante [employer].   Tenga en cuenta:  Es posible que note un poco de irritacin y congestin en la nariz o algn drenaje.  Esto es debido al oxgeno [employer] durante su procedimiento.  No hay que preocuparse y esto debe desaparecer ms o Scientist, research (medical).   SNTOMAS PARA REPORTAR INMEDIATAMENTE:  Despus de una endoscopia inferior (colonoscopia o sigmoidoscopia flexible):  Cantidades excesivas de sangre en las heces fecales  Sensibilidad significativa o empeoramiento de los dolores abdominales   Hinchazn aguda del abdomen que antes no tena   Fiebre de 100F o ms   Despus de la endoscopia superior (EGD)  Vmitos de Biochemist, clinical o material como caf molido   Dolor en el pecho o dolor debajo de los omplatos que antes no tena   Dolor o dificultad persistente para tragar  Falta de aire que antes no  tena   Fiebre de 100F o ms  Heces fecales negras y pegajosas   Para asuntos urgentes o de Freight forwarder, puede comunicarse con un gastroenterlogo a cualquier hora llamando al [phone].  DIETA:  FOLLOW DILATION DIET- SEE HANDOUT!!!  Tome muchos lquidos, Teacher, adult education las bebidas alcohlicas durante 24 horas.    ACTIVIDAD:  Debe planear tomarse las cosas con calma por el resto del da y no debe CONDUCIR ni usar maquinaria pesada Programmer, applications (debido a los medicamentos de sedacin utilizados durante el examen).     SEGUIMIENTO: Nuestro personal llamar al nmero que aparece en su historial al siguiente da hbil de su procedimiento para ver cmo se siente y para responder cualquier pregunta o inquietud que pueda tener con respecto a la informacin que se le dio despus del procedimiento. Si no podemos contactarle, le dejaremos un mensaje.  Sin embargo, si se siente bien y no tiene Paediatric nurse, no es necesario que nos devuelva la llamada.  Asumiremos que ha regresado a sus actividades diarias normales sin incidentes. Si se le tomaron algunas biopsias, le contactaremos por telfono o por carta en las prximas 3 semanas.  Si no ha sabido [employer] biopsias en el transcurso de 3 semanas, por favor llmenos al [phone].   FIRMAS/CONFIDENCIALIDAD: Usted y/o el acompaante que le cuide han firmado documentos que se ingresarn en su historial mdico electrnico.  Estas firmas atestiguan el hecho de que la informacin anterior   FOLLOW HIGH  FIBER DIET- SEE HANDOUT  Use Fibercon 1 Tablet daily  Await pathology  Continue your normal medications  Please read over handouts about esophageal stricture, hiatal hernia, high fiber diets and polyps

## 2019-07-20 NOTE — Progress Notes (Signed)
Temperature- June Bullock VS- [facility]  Interpreter used today at the [employer] for this pt.  Interpreter's name is- Johnsie Cancel

## 2019-07-20 NOTE — Progress Notes (Signed)
Report to PACU, RN, vss, BBS= Clear.  

## 2019-07-24 ENCOUNTER — Telehealth: Payer: Self-pay | Admitting: *Deleted

## 2019-07-24 NOTE — Telephone Encounter (Signed)
First follow up call attempt.  Left message on voicemail to call if any questions or concerns regarding procedure or [phone]

## 2019-08-01 ENCOUNTER — Encounter: Payer: Self-pay | Admitting: Gastroenterology

## 2019-08-17 ENCOUNTER — Encounter: Payer: Self-pay | Admitting: Gynecology

## 2019-10-13 ENCOUNTER — Other Ambulatory Visit: Payer: Self-pay

## 2019-10-13 DIAGNOSIS — Z20822 Contact with and (suspected) exposure to covid-19: Secondary | ICD-10-CM

## 2019-10-14 LAB — NOVEL CORONAVIRUS, NAA: SARS-CoV-2, NAA: NOT DETECTED

## 2019-11-11 ENCOUNTER — Other Ambulatory Visit: Payer: Self-pay | Admitting: Gastroenterology

## 2019-11-12 ENCOUNTER — Other Ambulatory Visit: Payer: Self-pay | Admitting: Gastroenterology

## 2020-01-24 ENCOUNTER — Other Ambulatory Visit: Payer: Self-pay

## 2020-01-25 ENCOUNTER — Encounter: Payer: Self-pay | Admitting: Obstetrics and Gynecology

## 2020-01-25 ENCOUNTER — Ambulatory Visit (INDEPENDENT_AMBULATORY_CARE_PROVIDER_SITE_OTHER): Payer: Medicare Other | Admitting: Obstetrics and Gynecology

## 2020-01-25 VITALS — BP 124/82 | Ht <= 58 in | Wt 134.0 lb

## 2020-01-25 DIAGNOSIS — M810 Age-related osteoporosis without current pathological fracture: Secondary | ICD-10-CM

## 2020-01-25 DIAGNOSIS — Z01419 Encounter for gynecological examination (general) (routine) without abnormal findings: Secondary | ICD-10-CM | POA: Diagnosis not present

## 2020-01-25 DIAGNOSIS — M818 Other osteoporosis without current pathological fracture: Secondary | ICD-10-CM

## 2020-01-25 NOTE — Progress Notes (Signed)
Mary Hunt 1940-12-24 ID:[id number]  SUBJECTIVE:  79 y.o. G3P3003 female for annual routine gynecologic exam. She has no gynecologic concerns. Mary Hunt present for language interpretation.  Current Outpatient Medications  Medication Sig Dispense Refill  . aspirin EC 81 MG tablet Take 81 mg by mouth daily.    Marland Kitchen CALCIUM-VITAMIN D PO Take 600 mg by mouth daily. D3    . diclofenac sodium (VOLTAREN) 1 % GEL Apply 2 g topically as directed.    Marland Kitchen ibuprofen (ADVIL,MOTRIN) 600 MG tablet Take 1 tablet (600 mg total) by mouth every 8 (eight) hours as needed for pain. 60 tablet 1  . Lancets (ONETOUCH ULTRASOFT) lancets   0  . Lancets MISC by Does not apply route.    Marland Kitchen lisinopril-hydrochlorothiazide (PRINZIDE,ZESTORETIC) 10-12.5 MG tablet Take by mouth.    . loratadine (CLARITIN) 10 MG tablet Take 1 tablet by mouth daily.    Marland Kitchen loteprednol (ALREX) 0.2 % SUSP Administer 1 drop to both eyes daily.    . metFORMIN (GLUMETZA) 500 MG (MOD) 24 hr tablet Take 500 mg by mouth daily with breakfast.      . omeprazole (PRILOSEC) 40 MG capsule TAKE 1 CAPSULE(40 MG) BY MOUTH DAILY 30 capsule 3  . ONE TOUCH ULTRA TEST test strip   0  . oxybutynin (DITROPAN-XL) 5 MG 24 hr tablet Take 1 tablet by mouth daily.     No current facility-administered medications for this visit.   Allergies: Statins and Penicillins  Patient's last menstrual period was 06/09/1991.  Past medical history,surgical history, problem list, medications, allergies, family history and social history were all reviewed and documented as reviewed in the EPIC chart.  ROS:  Feeling well. No dyspnea or chest pain on exertion.  No abdominal pain, change in bowel habits, black or bloody stools.  No urinary tract symptoms. GYN ROS: no abnormal bleeding, pelvic pain or discharge, no breast pain or new or enlarging lumps on self exam. No neurological complaints.  OBJECTIVE:  BP 124/82   Ht 4\' 9"  (1.448 m)   Wt 134 lb (60.8 kg)   LMP 06/09/1991   BMI  29.00 kg/m  The patient appears well, alert, oriented x 3, in no distress. ENT normal.  Neck supple. No cervical or supraclavicular adenopathy or thyromegaly.  Lungs are clear, good air entry, no wheezes, rhonchi or rales. S1 and S2 normal, no murmurs, regular rate and rhythm.  Abdomen soft without tenderness, guarding, mass or organomegaly.  Neurological is normal, no focal findings.  BREAST EXAM: breasts appear normal, no suspicious masses, no skin or nipple changes or axillary nodes  PELVIC EXAM: VULVA: normal appearing vulva with no masses, tenderness or lesions, atrophic changes, VAGINA: normal appearing vagina with normal color and discharge, no lesions, CERVIX: normal appearing cervix without discharge or lesions, UTERUS: uterus is normal size, shape, consistency and nontender, ADNEXA: normal adnexa in size, nontender and no masses  Chaperone: Mary Hunt present during the examination  ASSESSMENT:  79 y.o. [id number] here for annual gynecologic exam  PLAN:   1. Postmenopausal.  No concerns.  No vaginal bleeding, no hot flashes. 2. Pap smear 2012.  No Pap smear done today.  No history of abnormal Pap smears.  Per current screening guidelines we will stop screening. 3. Mammogram last in 2015.  Normal breast exam today.  Screening is recommended and she plans to schedule this. 4. Colonoscopy 2020.  Recommended that she follow up at the recommended interval.   5. Osteoporosis.  DEXA 05/2019.  Improvement  noted.  T-score AP spine -2.3.  Continue on Reclast.  We will have staff contact her to check calcium level and administer Reclast when due 04/05/2020.  Next DEXA recommended 05/2021. 6. Health maintenance.  No labs today as she normally has these completed with her primary care provider.  Return annually or sooner, prn.  Joseph Pierini MD, FACOG  01/25/20

## 2020-03-18 ENCOUNTER — Telehealth: Payer: Self-pay | Admitting: *Deleted

## 2020-03-18 DIAGNOSIS — M810 Age-related osteoporosis without current pathological fracture: Secondary | ICD-10-CM

## 2020-03-18 NOTE — Telephone Encounter (Addendum)
Recast instructions  Annual Exam (1 year) ? 01/25/2020  Called pt to verify insurance coverage / inform pt Reclast is in Process ? YES  Labs must be in 30 days window Serum Creatinine DATE? NEEDS LABS LAST LEVEL WAS ELEVATED WILL ASK DR [facility] FOR RECOMMENDATIONS   Serum Calcium 10.1 DATE? 03/19/2020   PA needed? NO  Cost for Pt? 80/20 for medicare, pt also has medicaid $0 for pt (REFERANCE [id number]) [place] [phone]   Order form filled out and faxed  w/MD sig?  Infusion will be done at ?  Pt aware?   Instructions mailed to pt?

## 2020-03-19 ENCOUNTER — Other Ambulatory Visit: Payer: Self-pay

## 2020-03-19 ENCOUNTER — Other Ambulatory Visit: Payer: Medicare Other

## 2020-03-19 DIAGNOSIS — M810 Age-related osteoporosis without current pathological fracture: Secondary | ICD-10-CM

## 2020-03-19 LAB — CREATININE, SERUM: Creat: 0.9 mg/dL — ABNORMAL HIGH (ref 0.60–0.88)

## 2020-03-19 LAB — CALCIUM: Calcium: 10.1 mg/dL (ref 8.6–10.4)

## 2020-03-21 NOTE — Telephone Encounter (Signed)
Pt is due for Reclast.Last creatine abnormal at  0.90. Will route to DR. Kendall for [employer]

## 2020-03-21 NOTE — Telephone Encounter (Signed)
She could make sure that she is well-hydrated and repeat the creatinine lab again to see if it is still abnormal, and if it is then we will need her to see an internist for further management of her bone health

## 2020-03-25 ENCOUNTER — Other Ambulatory Visit: Payer: Medicare Other

## 2020-03-25 ENCOUNTER — Other Ambulatory Visit: Payer: Self-pay

## 2020-03-25 DIAGNOSIS — M810 Age-related osteoporosis without current pathological fracture: Secondary | ICD-10-CM

## 2020-03-26 ENCOUNTER — Telehealth: Payer: Self-pay | Admitting: *Deleted

## 2020-03-26 ENCOUNTER — Other Ambulatory Visit: Payer: Self-pay

## 2020-03-26 DIAGNOSIS — M818 Other osteoporosis without current pathological fracture: Secondary | ICD-10-CM

## 2020-03-26 LAB — CREATININE, SERUM: Creat: 0.95 mg/dL — ABNORMAL HIGH (ref 0.60–0.88)

## 2020-03-26 NOTE — Telephone Encounter (Signed)
Per Dr. Delilah Shan results note "I would recommend a referral to an internist/endocrinologist to assist with further management of this patient's osteoporosis."   Referral placed at [place] they will call to schedule.

## 2020-03-27 NOTE — Telephone Encounter (Signed)
Labs rechecked abnormal. Per DR. Kendall  Per Dr. Delilah Shan results note "I would recommend a referral to an internist/endocrinologist to assist with further management of this patient's osteoporosis."   Referral placed at [place] they will call to schedule.   Will close encounter

## 2020-04-03 NOTE — Telephone Encounter (Signed)
Endo has left message for patient to call.

## 2020-04-10 NOTE — Telephone Encounter (Signed)
Patient scheduled on 05/23/20 with Dr.Ellison

## 2020-04-29 ENCOUNTER — Ambulatory Visit: Payer: Medicare Other | Attending: Internal Medicine

## 2020-04-29 DIAGNOSIS — Z20822 Contact with and (suspected) exposure to covid-19: Secondary | ICD-10-CM

## 2020-04-30 LAB — NOVEL CORONAVIRUS, NAA: SARS-CoV-2, NAA: NOT DETECTED

## 2020-04-30 LAB — SARS-COV-2, NAA 2 DAY TAT

## 2020-05-23 ENCOUNTER — Encounter: Payer: Self-pay | Admitting: Endocrinology

## 2020-05-23 ENCOUNTER — Ambulatory Visit (INDEPENDENT_AMBULATORY_CARE_PROVIDER_SITE_OTHER): Payer: Medicare Other | Admitting: Endocrinology

## 2020-05-23 ENCOUNTER — Other Ambulatory Visit: Payer: Self-pay

## 2020-05-23 VITALS — BP 115/60 | HR 66 | Ht <= 58 in | Wt 135.2 lb

## 2020-05-23 DIAGNOSIS — M810 Age-related osteoporosis without current pathological fracture: Secondary | ICD-10-CM

## 2020-05-23 LAB — TSH: TSH: 1.61 u[IU]/mL (ref 0.35–4.50)

## 2020-05-23 LAB — BASIC METABOLIC PANEL
BUN: 20 mg/dL (ref 6–23)
CO2: 28 mEq/L (ref 19–32)
Calcium: 10.2 mg/dL (ref 8.4–10.5)
Chloride: 99 mEq/L (ref 96–112)
Creatinine, Ser: 0.99 mg/dL (ref 0.40–1.20)
GFR: 53.87 mL/min — ABNORMAL LOW (ref >60.00)
Glucose, Bld: 214 mg/dL — ABNORMAL HIGH (ref 70–99)
Potassium: 4.5 mEq/L (ref 3.5–5.1)
Sodium: 134 mEq/L — ABNORMAL LOW (ref 135–145)

## 2020-05-23 LAB — VITAMIN D 25 HYDROXY (VIT D DEFICIENCY, FRACTURES): VITD: 86.69 ng/mL (ref 30.00–100.00)

## 2020-05-23 LAB — T4, FREE: Free T4: 1.05 ng/dL (ref 0.60–1.60)

## 2020-05-23 NOTE — Progress Notes (Signed)
Subjective:    Patient ID: Mary Hunt, female    DOB: [birth year], 79 y.o.   MRN: [id number]  HPI Pt is referred by Dr Luciana Axe, for osteoporosis.  Granddaughter translates.  Pt was noted to have osteoporosis in 2015.  She received Reclast 2018-2020.  she has never had bony fracture.  She has no history of any of the following: early menopause, cancer, renal dz, thyroid problems, prolonged bedrest, steroids, alcoholism, smoking, liver dz, or hyperparathyroidism.  She does not take heparin or anticonvulsants.  She take a vitamin-D supplement, but does not know what the dosage is.   Past Medical History:  Diagnosis Date  . Arthritis   . Colon polyps   . Depression   . Diabetes mellitus    TYPE 2  . Hypertension   . Osteoporosis 05/2019   T score -2.3.  Prior DEXA T score -2.6  . Vitamin D deficiency     Past Surgical History:  Procedure Laterality Date  . APPENDECTOMY    . BREAST SURGERY     BREAST BIOPSY-BENIGN    Social History   Socioeconomic History  . Marital status: Single    Spouse name: Not on file  . Number of children: Not on file  . Years of education: Not on file  . Highest education level: Not on file  Occupational History  . Not on file  Tobacco Use  . Smoking status: Never Smoker  . Smokeless tobacco: Never Used  Vaping Use  . Vaping Use: Never used  Substance and Sexual Activity  . Alcohol use: No  . Drug use: No  . Sexual activity: Not Currently    Comment: intercourse age 45, less than 5 sexual partners,des neg  Other Topics Concern  . Not on file  Social History Narrative  . Not on file   Social Determinants of Health   Financial Resource Strain:   . Difficulty of Paying Living Expenses:   Food Insecurity:   . Worried About Charity fundraiser in the Last Year:   . Arboriculturist in the Last Year:   Transportation Needs:   . Film/video editor (Medical):   Marland Kitchen Lack of Transportation (Non-Medical):   Physical Activity:   . Days of Exercise  per Week:   . Minutes of Exercise per Session:   Stress:   . Feeling of Stress :   Social Connections:   . Frequency of Communication with Friends and Family:   . Frequency of Social Gatherings with Friends and Family:   . Attends Religious Services:   . Active Member of Clubs or Organizations:   . Attends Archivist Meetings:   Marland Kitchen Marital Status:   Intimate Partner Violence:   . Fear of Current or Ex-Partner:   . Emotionally Abused:   Marland Kitchen Physically Abused:   . Sexually Abused:     Current Outpatient Medications on File Prior to Visit  Medication Sig Dispense Refill  . aspirin EC 81 MG tablet Take 81 mg by mouth daily.    Marland Kitchen CALCIUM-VITAMIN D PO Take 600 mg by mouth daily. D3    . diclofenac sodium (VOLTAREN) 1 % GEL Apply 2 g topically as directed.    Marland Kitchen ibuprofen (ADVIL,MOTRIN) 600 MG tablet Take 1 tablet (600 mg total) by mouth every 8 (eight) hours as needed for pain. 60 tablet 1  . Lancets (ONETOUCH ULTRASOFT) lancets   0  . Lancets MISC by Does not apply route.    Marland Kitchen  lisinopril-hydrochlorothiazide (PRINZIDE,ZESTORETIC) 10-12.5 MG tablet Take by mouth.    . loratadine (CLARITIN) 10 MG tablet Take 1 tablet by mouth daily.    Marland Kitchen loteprednol (ALREX) 0.2 % SUSP Administer 1 drop to both eyes daily.    . metFORMIN (GLUMETZA) 500 MG (MOD) 24 hr tablet Take 500 mg by mouth daily with breakfast.      . omeprazole (PRILOSEC) 40 MG capsule TAKE 1 CAPSULE(40 MG) BY MOUTH DAILY 30 capsule 3  . ONE TOUCH ULTRA TEST test strip   0  . oxybutynin (DITROPAN-XL) 5 MG 24 hr tablet Take 1 tablet by mouth daily.     No current facility-administered medications on file prior to visit.    Allergies  Allergen Reactions  . Statins     Elevated liver enzymes  . Penicillins Rash    REACTION: itching    Family History  Problem Relation Age of Onset  . Cancer Mother        stomach  . Stomach cancer Mother   . Breast cancer Sister   . Breast cancer Maternal Aunt   . Colon cancer Neg Hx    . Esophageal cancer Neg Hx   . Inflammatory bowel disease Neg Hx   . Liver disease Neg Hx   . Pancreatic cancer Neg Hx   . Rectal cancer Neg Hx   . Osteoporosis Neg Hx     BP 115/60   Pulse 66   Ht 4' 9.5" (1.461 m)   Wt 135 lb 3.2 oz (61.3 kg)   LMP 06/09/1991   SpO2 97%   BMI 28.75 kg/m    Review of Systems denies weight loss, falls, muscle cramps, and memory loss. she has chronic back pain, heartburn, and cold intolerance.      Objective:   Physical Exam VS: see vs page GEN: no distress HEAD: head: no deformity eyes: no periorbital swelling, no proptosis external nose and ears are normal NECK: supple, thyroid is not enlarged CHEST WALL: no deformity LUNGS: clear to auscultation CV: reg rate and rhythm, no murmur.  MUSCULOSKELETAL: muscle bulk and strength are grossly normal.  no obvious joint swelling.  gait is normal and steady EXTEMITIES: no deformity.  no edema NEURO:  cn 2-12 grossly intact.   readily moves all 4's.  sensation is intact to touch on all 4's SKIN:  Normal texture and temperature.  No rash or suspicious lesion is visible.   NODES:  None palpable at the neck PSYCH: alert, well-oriented.  Does not appear anxious nor depressed.  DEXA: worst T-score was -2.3 (several sites)   Lab Results  Component Value Date   CREATININE 0.95 (H) 03/25/2020   BUN 16 02/22/2017   NA 139 10/21/2010   K 3.9 10/21/2010   CL 106 10/21/2010   CO2 26 10/21/2010   outside test results are reviewed: Liver panel=normal.    I have reviewed outside records, and summarized: Pt was noted to have osteoporosis, and referred here.   Reclast was stopped due to an abnormal creat level.      Assessment & Plan:  Osteoporosis, new to me CRI: In my opinion, this is mild enough to continue Reclast  Patient Instructions  Blood tests are requested for you today.  We'll let you know about the results.  Please continue to get the bone density at [facility], and the  Reclast at [facility].   I would be happy to see you back here as needed.   Se solicitan anlisis de [employer]  para usted hoy. Te informaremos de [employer] [place] obteniendo la densidad sea en [facility] y el Reclast en [place]. Me alegrara volver a verte aqu cuando sea necesario.

## 2020-05-23 NOTE — Patient Instructions (Addendum)
Blood tests are requested for you today.  We'll let you know about the results.  Please continue to get the bone density at [facility], and the Reclast at [facility].   I would be happy to see you back here as needed.   Se solicitan anlisis de sangre para usted hoy. Te informaremos de [employer] Contine obteniendo la densidad sea en [facility] y el Reclast en [place]. Me alegrara volver a verte aqu cuando sea necesario.

## 2020-05-27 ENCOUNTER — Encounter: Payer: Self-pay | Admitting: Endocrinology

## 2020-05-27 LAB — PROTEIN ELECTROPHORESIS, SERUM
Albumin ELP: 3.9 g/dL (ref 3.8–4.8)
Alpha 1: 0.3 g/dL (ref 0.2–0.3)
Alpha 2: 1 g/dL — ABNORMAL HIGH (ref 0.5–0.9)
Beta 2: 0.5 g/dL (ref 0.2–0.5)
Beta Globulin: 0.5 g/dL (ref 0.4–0.6)
Gamma Globulin: 1.4 g/dL (ref 0.8–1.7)
Total Protein: 7.6 g/dL (ref 6.1–8.1)

## 2020-05-27 LAB — PTH, INTACT AND CALCIUM
Calcium: 10.3 mg/dL (ref 8.6–10.4)
PTH: 15 pg/mL (ref 14–64)

## 2020-06-27 ENCOUNTER — Telehealth: Payer: Self-pay | Admitting: *Deleted

## 2020-06-27 DIAGNOSIS — M810 Age-related osteoporosis without current pathological fracture: Secondary | ICD-10-CM

## 2020-06-27 NOTE — Telephone Encounter (Signed)
Yes, labs and Reclast ok

## 2020-06-27 NOTE — Telephone Encounter (Signed)
Per Dr. Delilah Shan last note "I would recommend a referral to an internist/endocrinologist to assist with further management of this patient's osteoporosis."   Pt seen Dr. Hilliard Clark 05/23/2020 Note below:  Please continue to get the bone density at [facility], and the Reclast at [facility].    Will route to Dr. Delilah Shan for ok to proceed with Re clast and obtain labs. Pt needs labs done within 30 days to proceed with Infusion

## 2020-06-28 NOTE — Telephone Encounter (Signed)
Pt will come in Monday for labs.

## 2020-07-01 ENCOUNTER — Other Ambulatory Visit: Payer: Medicare Other

## 2020-07-01 ENCOUNTER — Other Ambulatory Visit: Payer: Self-pay

## 2020-07-01 DIAGNOSIS — M810 Age-related osteoporosis without current pathological fracture: Secondary | ICD-10-CM

## 2020-07-02 LAB — CALCIUM: Calcium: 9.9 mg/dL (ref 8.6–10.4)

## 2020-07-02 LAB — CREATININE, SERUM: Creat: 0.93 mg/dL — ABNORMAL HIGH (ref 0.60–0.88)

## 2020-07-08 NOTE — Telephone Encounter (Signed)
Creatine labs done for Reclast.Will get recommendations from Dr. Delilah Shan if I  proceed with Reclast . Creatine level is  0.93.

## 2020-07-09 NOTE — Telephone Encounter (Signed)
I am not sure. That is why I said check with internist because her creatinine remains elevated. Thank you

## 2020-07-09 NOTE — Telephone Encounter (Addendum)
Reclast instructions  Annual Exam (1 year) ? 01/25/2020  Called pt to verify insurance coverage / inform pt Reclast is in Process ? yes  Labs must be in 30 days window Serum Creatinine 0.93  DATE 07/01/2020 Serum Calcium 9.9   DATE 07/01/2020    PA needed NO  Cost for PT $0 pt has medicare and medicaid  Order form filled out and faxed  w/MD sig?  Infusion will be done at ? [facility]  Pt aware? YES   Instructions mailed to pt?

## 2020-07-18 ENCOUNTER — Other Ambulatory Visit (HOSPITAL_COMMUNITY): Payer: Self-pay | Admitting: *Deleted

## 2020-07-19 ENCOUNTER — Other Ambulatory Visit: Payer: Self-pay

## 2020-07-19 ENCOUNTER — Ambulatory Visit (HOSPITAL_COMMUNITY)
Admission: RE | Admit: 2020-07-19 | Discharge: 2020-07-19 | Disposition: A | Discharge status: Home / Self Care | Payer: Medicare Other | Source: Ambulatory Visit | Attending: Obstetrics and Gynecology | Admitting: Obstetrics and Gynecology

## 2020-07-19 DIAGNOSIS — M810 Age-related osteoporosis without current pathological fracture: Secondary | ICD-10-CM | POA: Diagnosis not present

## 2020-07-19 MED ORDER — ZOLEDRONIC ACID 5 MG/100ML IV SOLN
INTRAVENOUS | Status: AC
  Filled 2020-07-19: qty 100

## 2020-07-19 MED ORDER — ZOLEDRONIC ACID 5 MG/100ML IV SOLN
5.0000 mg | Freq: Once | INTRAVENOUS | Status: AC
  Administered 2020-07-19: 5 mg via INTRAVENOUS

## 2020-07-19 NOTE — Discharge Instructions (Signed)
Zoledronic Acid injection (Paget's Disease, Osteoporosis) Qu es este medicamento? El CIDO ZOLEDRNICO reduce la prdida del calcio de los [place]. Se utiliza para tratar la enfermedad de [place] y osteoporosis en mujeres. Este medicamento puede ser utilizado para otros usos; si tiene alguna pregunta consulte con su proveedor de atencin mdica o con su farmacutico. MARCAS COMUNES: Reclast, Zometa Qu le debo informar a mi profesional de la salud antes de tomar este medicamento? Necesita saber si usted presenta alguno de los [employer] o situaciones:  asma sensible a la aspirina  cncer, especialmente si est recibiendo medicamentos que se usan para tratar Science writer  enfermedad dental o [place] dentadura postiza  infeccin  enfermedad renal  niveles bajos de calcio en la sangre  ciruga previa de la glndula paratiroidea o intestinales  est recibiendo corticoesteroides como dexametasona o prednisona  una reaccin alrgica o inusual al cido zoledrnico, a otros medicamentos, alimentos, colorantes o conservantes  si est embarazada o buscando quedar embarazada  si est amamantando a un beb Cmo debo utilizar este medicamento? Este medicamento se administra mediante infusin por va intravenosa. Lo administra un profesional de Technical sales engineer en un hospital o en un entorno clnico. Hable con su pediatra para informarse acerca del uso de este medicamento en nios. Este medicamento no est aprobado para uso en nios. Sobredosis: Pngase en contacto inmediatamente con un centro toxicolgico o una sala de urgencia si usted cree que haya tomado demasiado medicamento. ATENCIN: [employer] es solo para usted. No comparta este medicamento con nadie. Qu sucede si me olvido de una dosis? Es importante no olvidar ninguna dosis. Informe a su mdico o a su profesional de la salud si no puede asistir a Photographer. Qu puede interactuar con este medicamento?  ciertos antibiticos  administrados por Apple Computer, medicamentos para Conservation officer, historic buildings o inflamacin, como ibuprofeno o naproxeno  ciertos diurticos bumetanida o furosemida  teriparatida Puede ser que esta lista no menciona todas las posibles interacciones. Informe a su profesional de [employer] Home	[place] de [employer] Corporation productos a base de hierbas, medicamentos de [place] o suplementos nutritivos que est tomando. Si usted fuma, consume bebidas alcohlicas o si utiliza drogas ilegales, indqueselo tambin a su profesional de KB Home	[place]. Algunas sustancias pueden interactuar con su medicamento. A qu debo estar atento al usar Coca[employer]Cola? Visite a su mdico o a su profesional de la salud para chequeos peridicos. Puede ser necesario que transcurra cierto tiempo antes de que pueda observar los beneficios de [place]. No deje de tomar su medicamento excepto si as lo indica su mdico. Su mdico puede pedirle anlisis de [place] u otras pruebas para Chief Technology Officer su evolucin. Las mujeres deben informar a su mdico si estn buscando quedar embarazadas o si creen que estn embarazadas. Existe la posibilidad de efectos secundarios graves a un beb sin nacer. Para ms informacin hable con su profesional de la salud o su farmacutico. Es importante que usted reciba la cantidad [place] de calcio y vitamina D mientras recibe este medicamento. Consulte a su profesional de Apple Computer alimentos y vitamina que toma. Algunas personas que toman este medicamento experimentan dolor grave de [place], de articulaciones o/y de msculos. Este medicamento tambin puede aumentar el riesgo de problemas de la [place] o un fmur roto. Informe a su mdico inmediatamente si tiene dolor de mandbula, articulaciones o msculos. Si experimenta dolor que no desaparece o que empeora, informe a su mdico. Informe a su dentista y [place] dental que est [employer] Corporation  medicamento. No debera tener Office manager que est Microsoft. Visite a su dentista para someterse a un examen dental y arreglar cualquier problema dental antes de comenzar este medicamento. Cuide sus dientes mientras est News Corporation. Asegrese de visitar a su dentista para citas de control regulares. Qu efectos secundarios puedo tener al [employer] este medicamento? Efectos secundarios que debe informar a su mdico o a Barrister's clerk de la salud tan pronto como sea posible: Chief of Staff, como erupcin cutnea, comezn/picazn o urticarias, e hinchazn de la cara, los labios o la lengua anxiedad, confusin o depresin problemas respiratorios cambios en la visin dolor ocular sensacin de Youth worker o aturdimiento, cadas dolor de la mandbula, especialmente despus de tratamiento odontolgico llagas en la boca calambres, rigidez o debilidad muscular enrojecimiento, formacin de ampollas, descamacin o distensin de la piel, incluso dentro de la boca dificultad para orinar o cambios en el volumen de orina Efectos secundarios que generalmente no requieren atencin mdica (infrmelos a su mdico o a Barrister's clerk de la salud si persisten o si son molestos): dolores en los [place], las articulaciones o los msculos estreimiento diarrea fiebre cada del cabello irritacin en el lugar de la inyeccin prdida del apetito nuseas, vmito Higher education careers adviser dificultad para conciliar el sueo problemas para tragar debilidad o cansancio Puede ser que esta lista no menciona todos los posibles efectos secundarios. Comunquese a su mdico por asesoramiento mdico Hum[employer] Inc. Usted puede informar los efectos secundarios a la FDA por telfono al 1-800-FDA-1088. Dnde debo guardar mi medicina? Este medicamento se administra en hospitales o clnicas y no necesitar guardarlo en su domicilio. ATENCIN: Este folleto es un resumen. Puede ser que no cubra toda la posible informacin. Si usted tiene preguntas acerca de esta medicina,  consulte con su mdico, su farmacutico o su profesional de Technical sales engineer.  2020 Elsevier/Gold Standard (2016-11-26 00:00:00)

## 2020-08-01 NOTE — Telephone Encounter (Signed)
Reclast given 9/10/22020 Next IV 07/20/2020

## 2021-03-13 ENCOUNTER — Encounter: Payer: Self-pay | Admitting: Podiatry

## 2021-03-13 ENCOUNTER — Other Ambulatory Visit: Payer: Self-pay

## 2021-03-13 ENCOUNTER — Ambulatory Visit (INDEPENDENT_AMBULATORY_CARE_PROVIDER_SITE_OTHER): Payer: Medicare Other | Admitting: Podiatry

## 2021-03-13 DIAGNOSIS — M2011 Hallux valgus (acquired), right foot: Secondary | ICD-10-CM | POA: Diagnosis not present

## 2021-03-13 DIAGNOSIS — M2041 Other hammer toe(s) (acquired), right foot: Secondary | ICD-10-CM

## 2021-03-13 DIAGNOSIS — B351 Tinea unguium: Secondary | ICD-10-CM | POA: Diagnosis not present

## 2021-03-13 DIAGNOSIS — M79675 Pain in left toe(s): Secondary | ICD-10-CM

## 2021-03-13 DIAGNOSIS — M79674 Pain in right toe(s): Secondary | ICD-10-CM | POA: Diagnosis not present

## 2021-03-13 NOTE — Progress Notes (Signed)
This patient returns to my office for at risk foot care.  This patient requires this care by a professional since this patient will be at risk due to having diabetes.  This patient is unable to cut nails herself since the patient cannot reach her nails.These nails are painful walking and wearing shoes. She presents to the office with her granddaughter. This patient presents for at risk foot care today.  General Appearance  Alert, conversant and in no acute stress.  Vascular  Dorsalis pedis and posterior tibial  pulses are weakly  palpable  bilaterally.  Capillary return is within normal limits  bilaterally. Cold feet  Bilaterally.  Absent digital hair.  Neurologic  Senn-Weinstein monofilament wire test within normal limits  bilaterally. Muscle power within normal limits bilaterally.  Nails Thick disfigured discolored nails with subungual debris  Hallux  bilaterally. No evidence of bacterial infection or drainage bilaterally.  Orthopedic  No limitations of motion  feet .  No crepitus or effusions noted.  No bony pathology or digital deformities noted.  Skin  normotropic skin with no porokeratosis noted bilaterally.  No signs of infections or ulcers noted.     Onychomycosis  Pain in right toes  Pain in left toes  Consent was obtained for treatment procedures.   Mechanical debridement of nails 1-5  bilaterally performed with a nail nipper.  Filed with dremel without incident.    Return office visit   3 months                   Told patient to return for periodic foot care and evaluation due to potential at risk complications.   Gardiner Barefoot DPM

## 2021-04-03 ENCOUNTER — Encounter: Payer: Self-pay | Admitting: Nurse Practitioner

## 2021-04-08 ENCOUNTER — Other Ambulatory Visit: Payer: Self-pay

## 2021-04-08 ENCOUNTER — Ambulatory Visit (INDEPENDENT_AMBULATORY_CARE_PROVIDER_SITE_OTHER): Payer: Medicare Other | Admitting: Nurse Practitioner

## 2021-04-08 ENCOUNTER — Encounter: Payer: Self-pay | Admitting: Nurse Practitioner

## 2021-04-08 VITALS — BP 118/74 | Ht <= 58 in | Wt 125.0 lb

## 2021-04-08 DIAGNOSIS — L400 Psoriasis vulgaris: Secondary | ICD-10-CM

## 2021-04-08 DIAGNOSIS — Z780 Asymptomatic menopausal state: Secondary | ICD-10-CM | POA: Diagnosis not present

## 2021-04-08 DIAGNOSIS — M810 Age-related osteoporosis without current pathological fracture: Secondary | ICD-10-CM

## 2021-04-08 DIAGNOSIS — Z01419 Encounter for gynecological examination (general) (routine) without abnormal findings: Secondary | ICD-10-CM | POA: Diagnosis not present

## 2021-04-08 NOTE — Patient Instructions (Signed)
Health Maintenance After Age 82 After age 82, you are at a higher risk for certain long-term diseases and infections as well as injuries from falls. Falls are a major cause of broken bones and head injuries in people who are older than age 82. Getting regular preventive care can help to keep you healthy and well. Preventive care includes getting regular testing and making lifestyle changes as recommended by your health care provider. Talk with your health care provider about:  Which screenings and tests you should have. A screening is a test that checks for a disease when you have no symptoms.  A diet and exercise plan that is right for you. What should I know about screenings and tests to prevent falls? Screening and testing are the best ways to find a health problem early. Early diagnosis and treatment give you the best chance of managing medical conditions that are common after age 82. Certain conditions and lifestyle choices may make you more likely to have a fall. Your health care provider may recommend:  Regular vision checks. Poor vision and conditions such as cataracts can make you more likely to have a fall. If you wear glasses, make sure to get your prescription updated if your vision changes.  Medicine review. Work with your health care provider to regularly review all of the medicines you are taking, including over-the-counter medicines. Ask your health care provider about any side effects that may make you more likely to have a fall. Tell your health care provider if any medicines that you take make you feel dizzy or sleepy.  Osteoporosis screening. Osteoporosis is a condition that causes the bones to get weaker. This can make the bones weak and cause them to break more easily.  Blood pressure screening. Blood pressure changes and medicines to control blood pressure can make you feel dizzy.  Strength and balance checks. Your health care provider may recommend certain tests to check your  strength and balance while standing, walking, or changing positions.  Foot health exam. Foot pain and numbness, as well as not wearing proper footwear, can make you more likely to have a fall.  Depression screening. You may be more likely to have a fall if you have a fear of falling, feel emotionally low, or feel unable to do activities that you used to do.  Alcohol use screening. Using too much alcohol can affect your balance and may make you more likely to have a fall. What actions can I take to lower my risk of falls? General instructions  Talk with your health care provider about your risks for falling. Tell your health care provider if: ? You fall. Be sure to tell your health care provider about all falls, even ones that seem minor. ? You feel dizzy, sleepy, or off-balance.  Take over-the-counter and prescription medicines only as told by your health care provider. These include any supplements.  Eat a healthy diet and maintain a healthy weight. A healthy diet includes low-fat dairy products, low-fat (lean) meats, and fiber from whole grains, beans, and lots of fruits and vegetables. Home safety  Remove any tripping hazards, such as rugs, cords, and clutter.  Install safety equipment such as grab bars in bathrooms and safety rails on stairs.  Keep rooms and walkways well-lit. Activity  Follow a regular exercise program to stay fit. This will help you maintain your balance. Ask your health care provider what types of exercise are appropriate for you.  If you need a cane or walker,   use it as recommended by your health care provider.  Wear supportive shoes that have nonskid soles.   Lifestyle  Do not drink alcohol if your health care provider tells you not to drink.  If you drink alcohol, limit how much you have: ? 0-1 drink a day for women. ? 0-2 drinks a day for men.  Be aware of how much alcohol is in your drink. In the [place] one drink equals one typical bottle of beer (12  oz), one-half glass of wine (5 oz), or one shot of hard liquor (1 oz).  Do not use any products that contain nicotine or tobacco, such as cigarettes and e-cigarettes. If you need help quitting, ask your health care provider. Summary  Having a healthy lifestyle and getting preventive care can help to protect your health and wellness after age 82.  Screening and testing are the best way to find a health problem early and help you avoid having a fall. Early diagnosis and treatment give you the best chance for managing medical conditions that are more common for people who are older than age 82.  Falls are a major cause of broken bones and head injuries in people who are older than age 82. Take precautions to prevent a fall at home.  Work with your health care provider to learn what changes you can make to improve your health and wellness and to prevent falls. This information is not intended to replace advice given to you by your health care provider. Make sure you discuss any questions you have with your health care provider. Document Revised: 02/16/2019 Document Reviewed: 09/08/2017 Elsevier Patient Education  2021 Elsevier Inc.  

## 2021-04-08 NOTE — Progress Notes (Signed)
Mary Hunt 11-Feb-1941 [id number]   History:  80 y.o. G3P3003 presents for breast and pelvic exam. Postmenopausal - no HRT, no bleeding. Normal pap history. Osteoporosis on Reclast since 2018. Was referred to endocrinology (seen 05/23/2020) with recommendations to continue DXA at our office and continue Reclast. Last dose 07/2020. HTN, DM managed by PCP. She does complain of an area on her left lower leg that is getting bigger and is itchy. She has seen dermatology for this in the past but has not been using the cream as recommended. Interpreter [place] present during visit.   Gynecologic History Patient's last menstrual period was 06/09/1991.   Contraception: post menopausal status  Health Maintenance Last Pap: No longer screening per guidelines Last mammogram: 2015. Results were: Normal Last colonoscopy: 2020 Last Dexa: 05/16/2019. Results were: T-score -2.3, no FRAX score d/t reclast  Past medical history, past surgical history, family history and social history were all reviewed and documented in the EPIC chart. Single.   ROS:  A ROS was performed and pertinent positives and negatives are included.  Exam:  Vitals:   04/08/21 1333  BP: 118/74  Weight: 125 lb (56.7 kg)  Height: 4\' 9"  (1.448 m)   Body mass index is 27.05 kg/m.  General appearance:  Normal Thyroid:  Symmetrical, normal in size, without palpable masses or nodularity. Respiratory  Auscultation:  Clear without wheezing or rhonchi Cardiovascular  Auscultation:  Regular rate, without rubs, murmurs or gallops  Edema/varicosities:  Not grossly evident Abdominal  Soft,nontender, without masses, guarding or rebound.  Liver/spleen:  No organomegaly noted  Hernia:  None appreciated  Skin  Inspection:  Grossly normal. Left outer shin has red, scaley patch consistent with psoriasis Breasts: Examined lying and sitting.   Right: Without masses, retractions, nipple discharge or axillary  adenopathy.   Left: Without masses, retractions, nipple discharge or axillary adenopathy. Genitourinary   Inguinal/mons:  Normal without inguinal adenopathy  External genitalia:  Normal appearing vulva with no masses, tenderness, or lesions  BUS/Urethra/Skene's glands:  Normal  Vagina:  Normal appearing with normal color and discharge, no lesions. Atrophic changes  Cervix:  Normal appearing without discharge or lesions  Uterus:  Normal in size, shape and contour.  Midline and mobile, nontender  Adnexa/parametria:     Rt: Normal in size, without masses or tenderness.   Lt: Normal in size, without masses or tenderness.  Anus and perineum: Normal  Digital rectal exam: Normal sphincter tone without palpated masses or tenderness  Assessment/Plan:  80 y.o. [id number] for breast and pelvic exam.   Well female exam with routine gynecological exam - Education provided on SBEs, importance of preventative screenings, current guidelines, high calcium diet, regular exercise, and multivitamin daily. Labs done elsewhere.   Postmenopausal - Plan: DG Bone Density. No HRT, no bleeding.   Age-related osteoporosis without current pathological fracture - Plan: DG Bone Density. Last Dexa 05/16/2019 T-score -2.3, no FRAX score d/t reclast. She will schedule DXA for July. Continue daily vitamin D supplement and regular exercise. She is due for Reclast in September. We will help get this scheduled if appropriate based on DXA.   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 She has seen dermatology in the past with recommendations to use a cream but she has not been using. Recommend restarting cream consistently and seeing dermatology for management.   Screening for cervical cancer - Normal Pap history.  No longer screening per guidelines.  Screening for breast cancer - Normal mammogram history.Last mammogram in 2015. Recommend annual screenings and she will call to schedule  this at [facility]. Normal breast exam today.  Screening for colon cancer - 2020 colonoscopy. Preventative screenings no longer recommended per GI.  Return in 1 year for breast and pelvic exam.    Tamela Gammon DNP, 2:09 PM 04/08/2021

## 2021-05-20 ENCOUNTER — Other Ambulatory Visit: Payer: Self-pay | Admitting: Nurse Practitioner

## 2021-05-20 ENCOUNTER — Other Ambulatory Visit: Payer: Self-pay

## 2021-05-20 ENCOUNTER — Encounter (INDEPENDENT_AMBULATORY_CARE_PROVIDER_SITE_OTHER): Payer: Medicare Other

## 2021-05-20 DIAGNOSIS — M810 Age-related osteoporosis without current pathological fracture: Secondary | ICD-10-CM

## 2021-05-20 DIAGNOSIS — Z780 Asymptomatic menopausal state: Secondary | ICD-10-CM

## 2021-05-20 DIAGNOSIS — M8589 Other specified disorders of bone density and structure, multiple sites: Secondary | ICD-10-CM

## 2021-06-13 ENCOUNTER — Ambulatory Visit (INDEPENDENT_AMBULATORY_CARE_PROVIDER_SITE_OTHER): Payer: Medicare Other | Admitting: Podiatry

## 2021-06-13 ENCOUNTER — Other Ambulatory Visit: Payer: Self-pay

## 2021-06-13 ENCOUNTER — Encounter: Payer: Self-pay | Admitting: Podiatry

## 2021-06-13 DIAGNOSIS — M79675 Pain in left toe(s): Secondary | ICD-10-CM

## 2021-06-13 DIAGNOSIS — M2041 Other hammer toe(s) (acquired), right foot: Secondary | ICD-10-CM

## 2021-06-13 DIAGNOSIS — B351 Tinea unguium: Secondary | ICD-10-CM

## 2021-06-13 DIAGNOSIS — M79674 Pain in right toe(s): Secondary | ICD-10-CM | POA: Diagnosis not present

## 2021-06-13 DIAGNOSIS — M2011 Hallux valgus (acquired), right foot: Secondary | ICD-10-CM

## 2021-06-13 NOTE — Progress Notes (Signed)
This patient returns to my office for at risk foot care.  This patient requires this care by a professional since this patient will be at risk due to having diabetes.  This patient is unable to cut nails herself since the patient cannot reach her nails.These nails are painful walking and wearing shoes. She presents to the office with her granddaughter. This patient presents for at risk foot care today.  General Appearance  Alert, conversant and in no acute stress.  Vascular  Dorsalis pedis and posterior tibial  pulses are weakly  palpable  bilaterally.  Capillary return is within normal limits  bilaterally. Cold feet  Bilaterally.  Absent digital hair.  Neurologic  Senn-Weinstein monofilament wire test within normal limits  bilaterally. Muscle power within normal limits bilaterally.  Nails Thick disfigured discolored nails with subungual debris  Hallux  bilaterally. No evidence of bacterial infection or drainage bilaterally.  Orthopedic  No limitations of motion  feet .  No crepitus or effusions noted.  No bony pathology or digital deformities noted.  Skin  normotropic skin with no porokeratosis noted bilaterally.  No signs of infections or ulcers noted.     Onychomycosis  Pain in right toes  Pain in left toes  Consent was obtained for treatment procedures.   Mechanical debridement of nails 1-5  bilaterally performed with a nail nipper.  Filed with dremel without incident.    Return office visit   3 months                   Told patient to return for periodic foot care and evaluation due to potential at risk complications.   Gardiner Barefoot DPM

## 2021-07-08 ENCOUNTER — Telehealth: Payer: Self-pay | Admitting: *Deleted

## 2021-07-08 DIAGNOSIS — M810 Age-related osteoporosis without current pathological fracture: Secondary | ICD-10-CM

## 2021-07-08 NOTE — Telephone Encounter (Addendum)
Recast instructions  Annual Exam (1 year) ? 04/08/2021  Called pt to verify insurance coverage / inform pt Reclast is in Process ?  Labs must be in 30 days window Serum Creatinine 0.98 ok per Dr. Dellis Filbert to proceed  DATE? 07/10/2021 Serum Calcium 9.9 DATE? 07/10/2021   PA needed? NO  Cost for Pt? $0 pt has medicare and medicaid  Order form filled out and faxed  w/MD sig?  Infusion will be done at ? [facility]  Pt aware? YES  Instructions mailed to pt? YES  Appt 08/01/2021 @ 11:00

## 2021-07-10 ENCOUNTER — Other Ambulatory Visit: Payer: Self-pay

## 2021-07-10 ENCOUNTER — Other Ambulatory Visit: Payer: Medicare Other

## 2021-07-10 DIAGNOSIS — M810 Age-related osteoporosis without current pathological fracture: Secondary | ICD-10-CM

## 2021-07-10 LAB — CALCIUM: Calcium: 9.9 mg/dL (ref 8.6–10.4)

## 2021-07-10 LAB — CREATININE, SERUM: Creat: 0.98 mg/dL — ABNORMAL HIGH (ref 0.60–0.95)

## 2021-07-15 NOTE — Telephone Encounter (Signed)
Patient will be due for her Reclast soon her creatine labs came back out of range at  0.98. Routing to Provider for recommendations.

## 2021-07-31 ENCOUNTER — Other Ambulatory Visit (HOSPITAL_COMMUNITY): Payer: Self-pay | Admitting: *Deleted

## 2021-08-01 ENCOUNTER — Ambulatory Visit (HOSPITAL_COMMUNITY)
Admission: RE | Admit: 2021-08-01 | Discharge: 2021-08-01 | Disposition: A | Discharge status: Home / Self Care | Payer: Medicare Other | Source: Ambulatory Visit | Attending: Obstetrics & Gynecology | Admitting: Obstetrics & Gynecology

## 2021-08-01 ENCOUNTER — Other Ambulatory Visit: Payer: Self-pay

## 2021-08-01 DIAGNOSIS — M810 Age-related osteoporosis without current pathological fracture: Secondary | ICD-10-CM | POA: Diagnosis present

## 2021-08-01 MED ORDER — ZOLEDRONIC ACID 5 MG/100ML IV SOLN
INTRAVENOUS | Status: AC
  Filled 2021-08-01: qty 100

## 2021-08-01 MED ORDER — ZOLEDRONIC ACID 5 MG/100ML IV SOLN
5.0000 mg | Freq: Once | INTRAVENOUS | Status: AC
  Administered 2021-08-01: 5 mg via INTRAVENOUS

## 2021-08-17 IMAGING — RF SWALLOWING FUNCTION
11 series · 12 of 24 positions shown · non-contrast
Comparison: None.

CLINICAL DATA: Dysphagia. Cough/GE reflux disease/other secondary
diagnosis

EXAM:
MODIFIED BARIUM SWALLOW
TECHNIQUE: Different consistencies of barium were administered orally to the
patient by the Speech Pathologist. Imaging of the pharynx was
performed in the lateral projection. The radiologist was present in
the fluoroscopy room for this study, providing personal supervision.
FLUOROSCOPY TIME:  Fluoroscopy Time:  51 seconds

[Series 1: run · 1 of 16 frames shown (1 of 11)]
[frame 9/16]
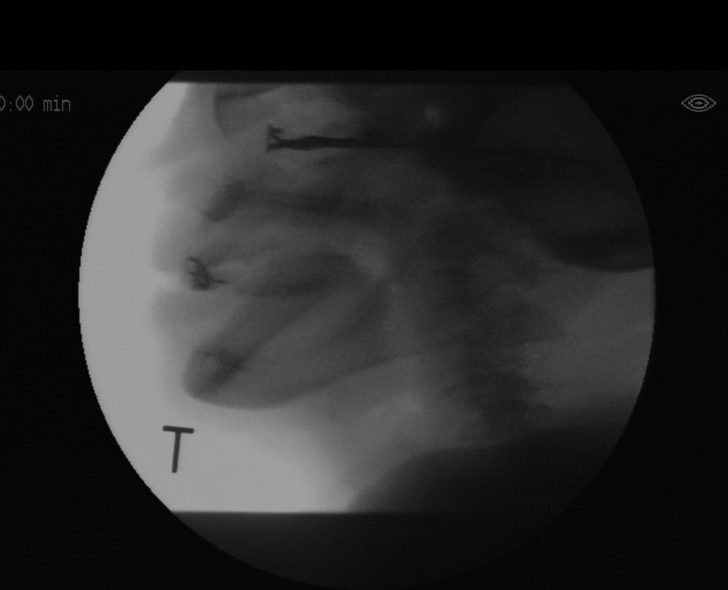

[Series 2: run · 1 of 50 frames shown (2 of 11)]
[frame 34/50]
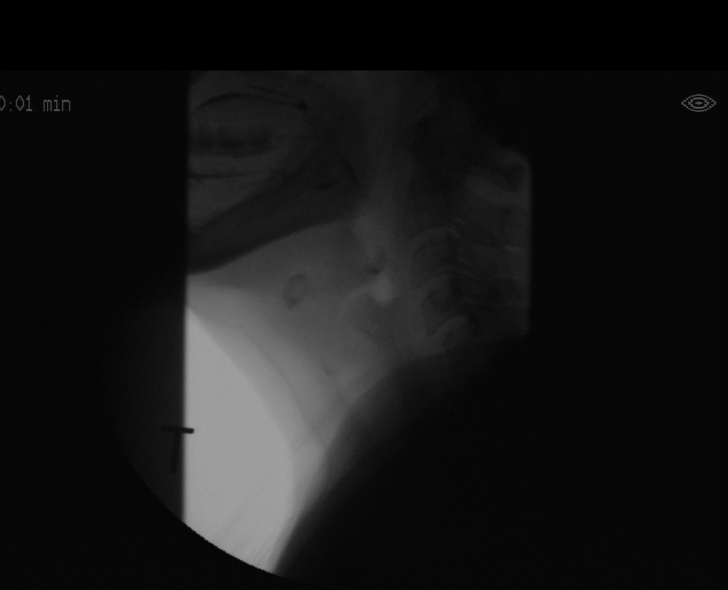

[Series 3: run · 1 of 126 frames shown (3 of 11)]
[frame 43/126]
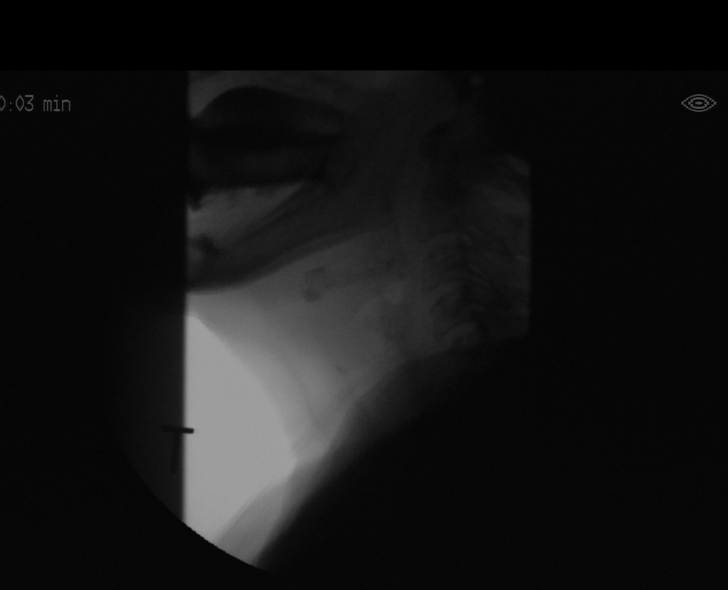

[Series 4: run · 1 of 314 frames shown (4 of 11)]
[frame 48/314]
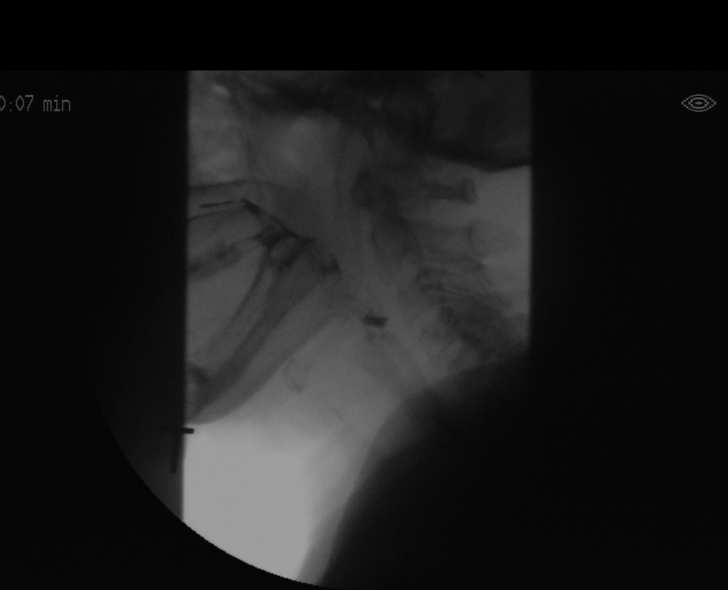

[Series 5: run · 1 of 59 frames shown (5 of 11)]
[frame 30/59]
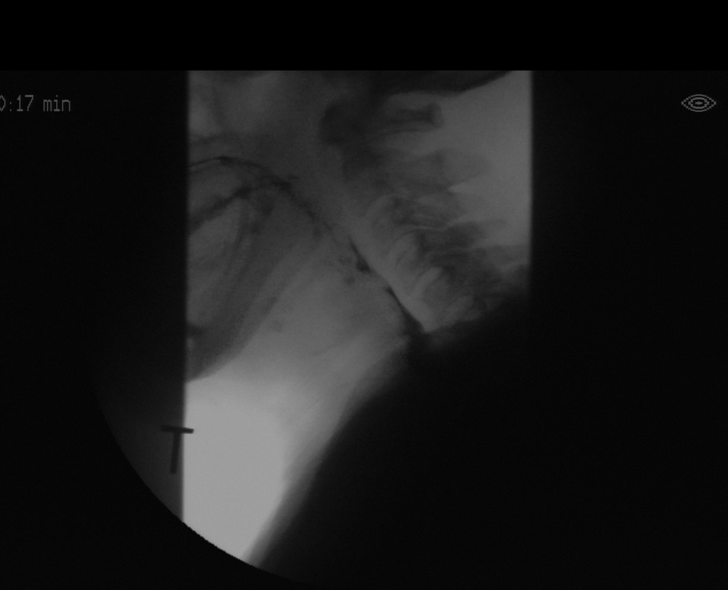

[Series 6: run · 1 of 78 frames shown (6 of 11)]
[frame 30/78]
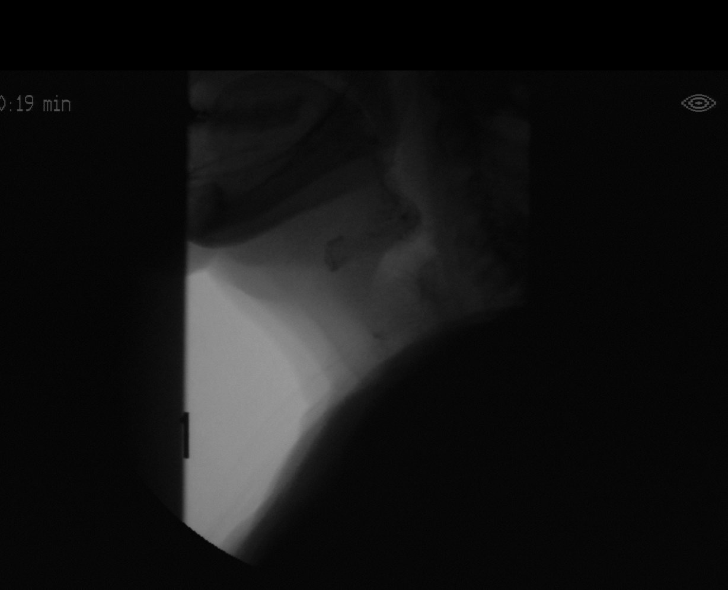

[Series 7: run · 1 of 54 frames shown (7 of 11)]
[frame 1/54]
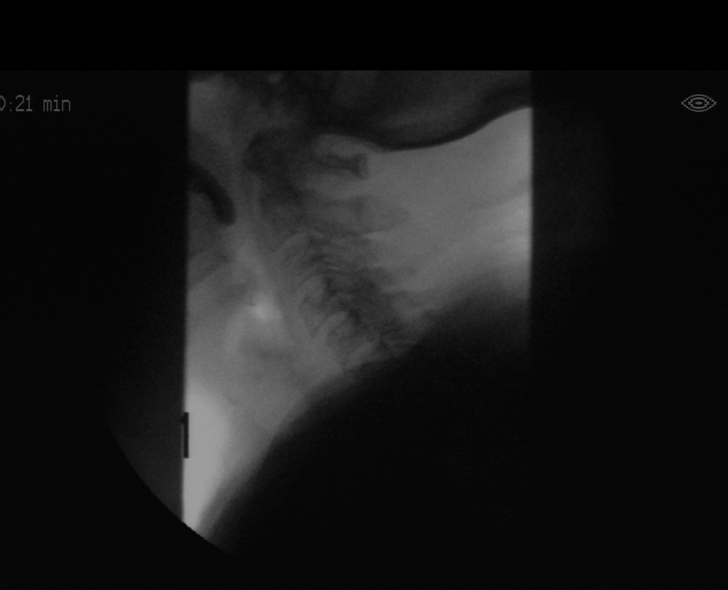

[Series 8: run · 1 of 128 frames shown (8 of 11)]
[frame 20/128]
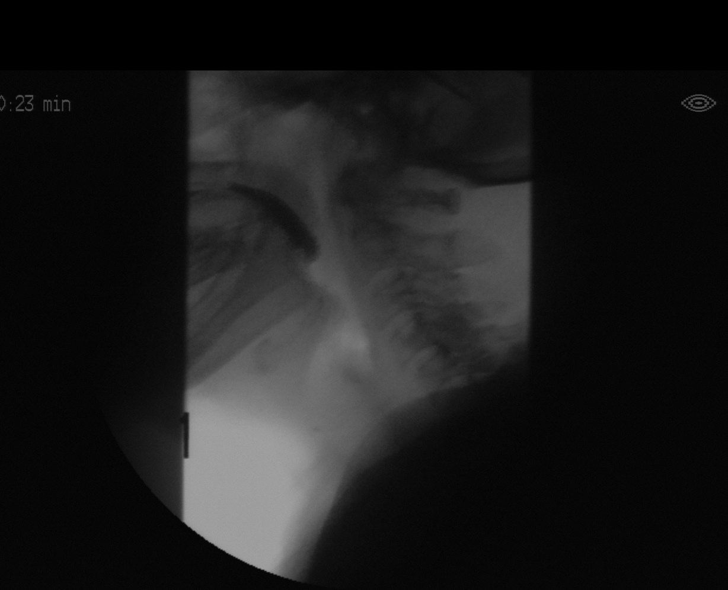

[Series 9: run · 1 of 475 frames shown (9 of 11)]
[frame 3/475]
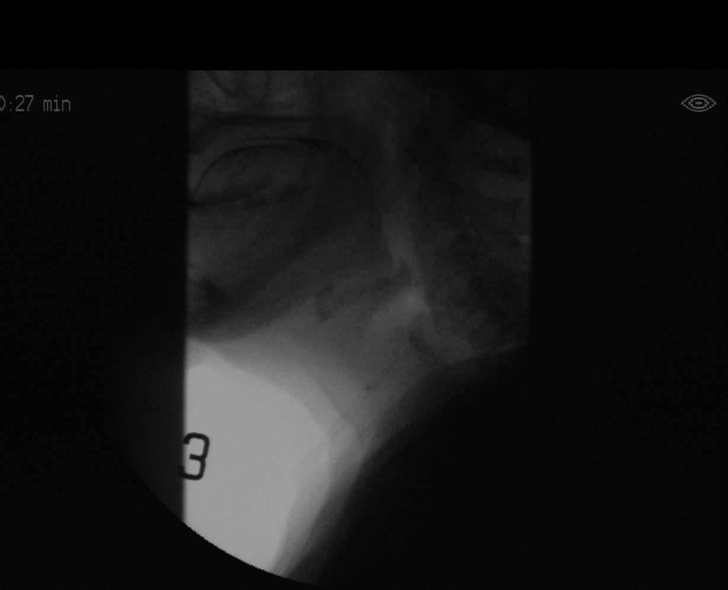

[Series 10: run · 2 of 185 frames shown (10 of 11)]
[frame 1/185]
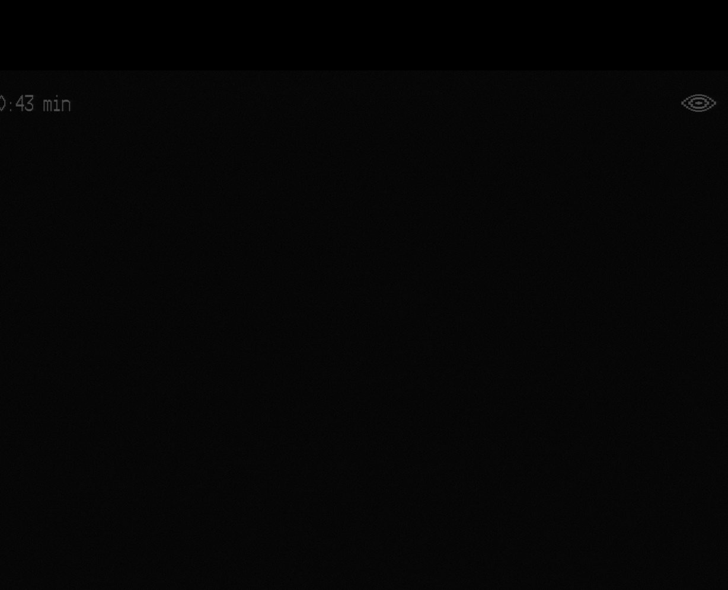
[frame 158/185]
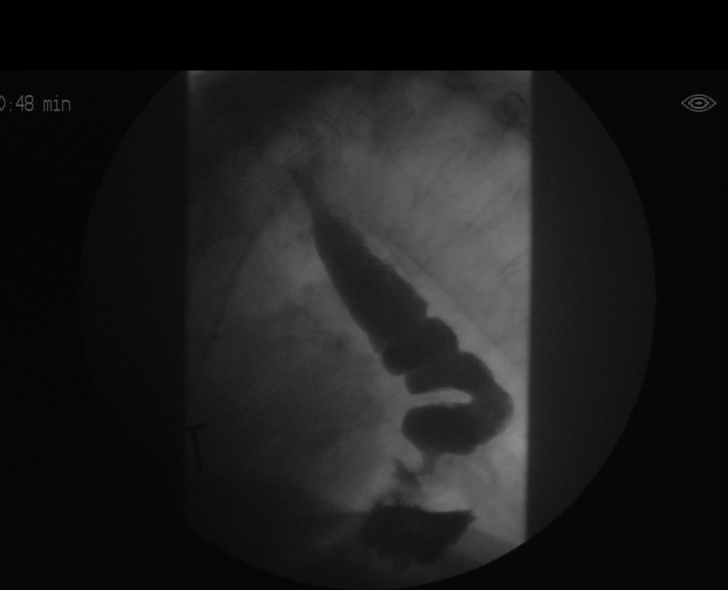

[Series 11: run · 1 of 73 frames shown (11 of 11)]
[frame 72/73]
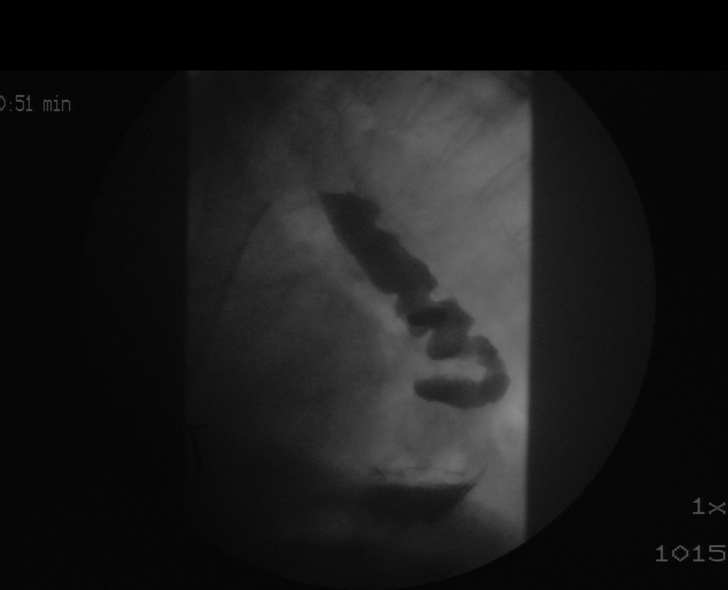

[12 of 24 positions shown; findings below may reference images not displayed]

FINDINGS: No tracheal penetration or aspiration.
IMPRESSION: Normal swallowing function.  No penetration or aspiration

Please refer to the Speech Pathologists report for complete details
and recommendations.

## 2021-09-15 ENCOUNTER — Ambulatory Visit: Payer: Medicare Other | Admitting: Podiatry

## 2022-03-18 ENCOUNTER — Telehealth: Payer: Self-pay

## 2022-03-18 NOTE — Telephone Encounter (Signed)
Patient's daughter called. Patient is scheduled for her AEX on 04/14/22. She said that they also need to schedule her Reclast injection.  She did not know if they needed to wait until after she had AEX to be able to do that. ?

## 2022-03-30 NOTE — Telephone Encounter (Signed)
Called patient left message to call us back  Reclast is given once a year. Patients next due date is  08/01/2022  Pt called back understands above message

## 2022-04-13 NOTE — Progress Notes (Deleted)
   Mary Hunt 1941-03-17 [id number]   History:  81 y.o. G3P3003 presents for breast and pelvic exam. Postmenopausal - no HRT, no bleeding. Normal pap history. Osteoporosis on Reclast since 2018. Last dose 07/2020. HTN, DM managed by PCP.  Gynecologic History Patient's last menstrual period was 06/09/1991.   Contraception: post menopausal status Sexually active: No  Health Maintenance Last Pap: 04/30/2011. Results were: Normal Last mammogram: 2015. Results were: Normal Last colonoscopy: 07/20/2019. Results were: Polyps, 3-year recall Last Dexa: 05/20/2021. Results were: T-score -2.0, no FRAX score d/t reclast  Past medical history, past surgical history, family history and social history were all reviewed and documented in the EPIC chart. Single. Sister and maternal aunt with history of breast cancer.  ROS:  A ROS was performed and pertinent positives and negatives are included.  Exam:  There were no vitals filed for this visit.  There is no height or weight on file to calculate BMI.  General appearance:  Normal Thyroid:  Symmetrical, normal in size, without palpable masses or nodularity. Respiratory  Auscultation:  Clear without wheezing or rhonchi Cardiovascular  Auscultation:  Regular rate, without rubs, murmurs or gallops  Edema/varicosities:  Not grossly evident Abdominal  Soft,nontender, without masses, guarding or rebound.  Liver/spleen:  No organomegaly noted  Hernia:  None appreciated  Skin  Inspection:  Grossly normal. Left outer shin has red, scaley patch consistent with psoriasis Breasts: Examined lying and sitting.   Right: Without masses, retractions, nipple discharge or axillary adenopathy.   Left: Without masses, retractions, nipple discharge or axillary adenopathy. Genitourinary   Inguinal/mons:  Normal without inguinal adenopathy  External genitalia:  Normal appearing vulva with no masses, tenderness, or lesions  BUS/Urethra/Skene's glands:  Normal  Vagina:   Normal appearing with normal color and discharge, no lesions. Atrophic changes  Cervix:  Normal appearing without discharge or lesions  Uterus:  Normal in size, shape and contour.  Midline and mobile, nontender  Adnexa/parametria:     Rt: Normal in size, without masses or tenderness.   Lt: Normal in size, without masses or tenderness.  Anus and perineum: Normal  Digital rectal exam: Normal sphincter tone without palpated masses or tenderness  Patient informed chaperone available to be present for breast and pelvic exam. Patient has requested no chaperone to be present. Patient has been advised what will be completed during breast and pelvic exam.   Assessment/Plan:  81 y.o. G3P3003 for breast and pelvic exam.   Well female exam with routine gynecological exam - Education provided on SBEs, importance of preventative screenings, current guidelines, high calcium diet, regular exercise, and multivitamin daily. Labs done elsewhere.   Postmenopausal - No HRT, no bleeding.   Age-related osteoporosis without current pathological fracture - T-score -2.0 July 2022. On Reclast. Continue daily vitamin D supplement and regular exercise.   Screening for cervical cancer - Normal Pap history.  No longer screening per guidelines.   Screening for breast cancer - Normal mammogram history.Last mammogram in 2015. Recommend annual screenings and she will call to schedule this at [facility]. Normal breast exam today.  Screening for colon cancer - 2020 colonoscopy. Preventative screenings no longer recommended per GI.  Return in 1 year for breast and pelvic exam.    Tamela Gammon DNP, 11:41 AM 04/13/2022

## 2022-04-14 ENCOUNTER — Ambulatory Visit: Payer: Medicare Other | Admitting: Nurse Practitioner

## 2022-04-14 ENCOUNTER — Encounter: Payer: Self-pay | Admitting: Nurse Practitioner

## 2022-04-14 ENCOUNTER — Ambulatory Visit (INDEPENDENT_AMBULATORY_CARE_PROVIDER_SITE_OTHER): Payer: Medicare Other | Admitting: Nurse Practitioner

## 2022-04-14 VITALS — BP 122/74 | Ht <= 58 in | Wt 130.0 lb

## 2022-04-14 DIAGNOSIS — Z780 Asymptomatic menopausal state: Secondary | ICD-10-CM | POA: Diagnosis not present

## 2022-04-14 DIAGNOSIS — M810 Age-related osteoporosis without current pathological fracture: Secondary | ICD-10-CM

## 2022-04-14 DIAGNOSIS — N762 Acute vulvitis: Secondary | ICD-10-CM

## 2022-04-14 DIAGNOSIS — B3731 Acute candidiasis of vulva and vagina: Secondary | ICD-10-CM

## 2022-04-14 DIAGNOSIS — Z01419 Encounter for gynecological examination (general) (routine) without abnormal findings: Secondary | ICD-10-CM

## 2022-04-14 LAB — WET PREP FOR TRICH, YEAST, CLUE

## 2022-04-14 MED ORDER — CLOBETASOL PROPIONATE 0.05 % EX OINT: 1.0000 "application " | TOPICAL_OINTMENT | Freq: Two times a day (BID) | CUTANEOUS | 0 refills | Status: DC

## 2022-04-14 MED ORDER — FLUCONAZOLE 150 MG PO TABS: 150.0000 mg | ORAL_TABLET | ORAL | 0 refills | Status: DC

## 2022-04-14 NOTE — Progress Notes (Signed)
Mary Hunt 11/14/1940 [id number]   History:  81 y.o. G3P3003 presents for breast and pelvic exam. Complains of intermittent vulvar itching. Denies discharge or odor. Postmenopausal - no HRT, no bleeding. Normal pap history. Osteoporosis on Reclast since 2018, last dose 07/2021. HTN, DM managed by PCP. Interpreter present during visit.   Gynecologic History Patient's last menstrual period was 06/09/1991.   Contraception: post menopausal status Sexually active: No  Health Maintenance Last Pap: 04/30/2011. Results were: Normal Last mammogram: 2015. Results were: Normal Last colonoscopy: 07/20/2019. Results were: Polyps, screenings no longer recommended per GI Last Dexa: 05/20/2021. Results were: T-score -2.0, no FRAX score d/t reclast  Past medical history, past surgical history, family history and social history were all reviewed and documented in the EPIC chart. Single. 3 children live local, 5 grandchildren. Sister and maternal aunt with history of breast cancer.  ROS:  A ROS was performed and pertinent positives and negatives are included.  Exam:  Vitals:   04/14/22 0903  BP: 122/74  Weight: 130 lb (59 kg)  Height: '4\' 9"'$  (1.448 m)    Body mass index is 28.13 kg/m.  General appearance:  Normal Thyroid:  Symmetrical, normal in size, without palpable masses or nodularity. Respiratory  Auscultation:  Clear without wheezing or rhonchi Cardiovascular  Auscultation:  Regular rate, without rubs, murmurs or gallops  Edema/varicosities:  Not grossly evident Abdominal  Soft,nontender, without masses, guarding or rebound.  Liver/spleen:  No organomegaly noted  Hernia:  None appreciated  Skin  Inspection:  Grossly normal. Left outer shin has red, scaley patch consistent with psoriasis Breasts: Examined lying and sitting.   Right: Without masses, retractions, nipple discharge or axillary adenopathy.   Left: Without masses, retractions, nipple discharge or axillary  adenopathy. Genitourinary   Inguinal/mons:  Normal without inguinal adenopathy  External genitalia:  Redness along labia minor and majora  BUS/Urethra/Skene's glands:  Normal  Vagina:  Normal appearing with normal color and discharge, no lesions. Atrophic changes  Cervix:  Normal appearing without discharge or lesions  Uterus:  Normal in size, shape and contour.  Midline and mobile, nontender  Adnexa/parametria:     Rt: Normal in size, without masses or tenderness.   Lt: Normal in size, without masses or tenderness.  Anus and perineum: Normal  Digital rectal exam: Normal sphincter tone without palpated masses or tenderness  Patient informed chaperone available to be present for breast and pelvic exam. Patient has requested no chaperone to be present. Patient has been advised what will be completed during breast and pelvic exam.   Wet prep + yeast  Assessment/Plan:  81 y.o. G3P3003 for breast and pelvic exam.   Well female exam with routine gynecological exam - Education provided on SBEs, importance of preventative screenings, current guidelines, high calcium diet, regular exercise, and multivitamin daily. Labs done with PCP.   Postmenopausal - No HRT, no bleeding.   Age-related osteoporosis without current pathological fracture - T-score -2.0 July 2022. On Reclast. Last dose 07/2021, due in September. Continue daily vitamin D supplement and regular exercise.   Acute vulvitis - Plan: WET PREP FOR TRICH, YEAST, CLUE, clobetasol ointment (TEMOVATE) 0.05 % twice daily x 7-10 days. She has significant erythema.   Vaginal candidiasis - Plan: fluconazole (DIFLUCAN) 150 MG tablet today and repeat in 3 days for total of 2 doses.   Screening for cervical cancer - Normal Pap history.  No longer screening per guidelines.   Screening for breast cancer - Normal mammogram history.Last mammogram in 2015. Recommend  annual screenings. We will call to schedule this for her. Normal breast exam  today.  Screening for colon cancer - 2020 colonoscopy. Preventative screenings no longer recommended per GI.  Return in 2 years for breast and pelvic exam.    Tamela Gammon DNP, 9:36 AM 04/14/2022

## 2022-04-15 ENCOUNTER — Telehealth: Payer: Self-pay

## 2022-04-15 DIAGNOSIS — Z1231 Encounter for screening mammogram for malignant neoplasm of breast: Secondary | ICD-10-CM

## 2022-04-15 NOTE — Telephone Encounter (Signed)
-----   Message from Tamela Gammon, NP sent at 04/14/2022  9:45 AM EDT ----- Regarding: screening mammogram Please send referral for screening mammogram to [facility]. She is spanish speaking. Thanks.

## 2022-04-15 NOTE — Telephone Encounter (Signed)
Order placed on Tiffany's desk for authorization.

## 2022-04-16 NOTE — Telephone Encounter (Signed)
Order faxed successfully.   Would you mind calling the patient to let her know that her mammogram order has been faxed to [facility] mammography in [place]. If she doesn't get a call from them within the next week. She can call them anytime to schedule her appt.   In case she doesn't have it, their number is [phone].  Thanks.

## 2022-04-16 NOTE — Telephone Encounter (Signed)
FYI.  Per Earnest Bailey: "Called patient and left her a detailed message with all the information regarding her mammogram referral. All information was left in Spanish. I advised her to give [place] a call if she had any questions."

## 2022-07-23 ENCOUNTER — Telehealth: Payer: Self-pay | Admitting: *Deleted

## 2022-07-23 DIAGNOSIS — M810 Age-related osteoporosis without current pathological fracture: Secondary | ICD-10-CM

## 2022-07-23 NOTE — Telephone Encounter (Addendum)
Reclast instructions   Annual Exam (1 year) ? 04/14/2022  Called pt to verify insurance coverage / inform pt Reclast is in Process ?  Labs must be in 30 days window Serum Creatinine 1.13  DATE 09/03/2022 per provider ok to have reclast Serum Calcium 10.3  DATE? 10.3     PA needed? No reference #   Cost for Pt?  $0 pt has medicare and medicaid   Order form filled out and faxed  w/MD sig?  Infusion will be done at ?  Pt aware?  Instructions mailed to pt?

## 2022-08-11 NOTE — Telephone Encounter (Signed)
Spoke with referring to labs and 30 day window. Patient will have labs drawn once results come back. We will schedule infusion

## 2022-08-14 ENCOUNTER — Other Ambulatory Visit: Payer: Medicare Other

## 2022-08-14 ENCOUNTER — Ambulatory Visit (INDEPENDENT_AMBULATORY_CARE_PROVIDER_SITE_OTHER): Payer: Medicare Other | Admitting: Radiology

## 2022-08-14 VITALS — BP 110/60

## 2022-08-14 DIAGNOSIS — R300 Dysuria: Secondary | ICD-10-CM | POA: Diagnosis not present

## 2022-08-14 DIAGNOSIS — M810 Age-related osteoporosis without current pathological fracture: Secondary | ICD-10-CM

## 2022-08-14 MED ORDER — SULFAMETHOXAZOLE-TRIMETHOPRIM 800-160 MG PO TABS: 1.0000 | ORAL_TABLET | Freq: Two times a day (BID) | ORAL | 0 refills | Status: AC

## 2022-08-14 MED ORDER — ESTRADIOL 0.1 MG/GM VA CREA: 1.0000 | TOPICAL_CREAM | VAGINAL | 12 refills | Status: DC

## 2022-08-14 NOTE — Progress Notes (Signed)
      SUBJECTIVE: Mary Hunt is a 81 y.o. female  here with Spanish interpreter who complains of dysuria, frequency, urgency, x's 20 days (tried AZO without relief). Desires  refill on estrace vaginal cream to help with dysuria, denies vaginal discharge, has external burning with urination only.  Allergies  Allergen Reactions   Statins     Elevated liver enzymes   Penicillins Rash    REACTION: itching    Current Outpatient Medications on File Prior to Visit  Medication Sig Dispense Refill   candesartan (ATACAND) 16 MG tablet Take by mouth.     carvedilol (COREG) 6.25 MG tablet Take by mouth.     clobetasol ointment (TEMOVATE) 3.55 % Apply 1 application. topically 2 (two) times daily. 30 g 0   Dapagliflozin-metFORMIN HCl ER (XIGDUO XR) 08-999 MG TB24 Take by mouth.     ezetimibe (ZETIA) 10 MG tablet Take 1 tablet by mouth daily.     fenofibrate (TRICOR) 145 MG tablet Take 1 tablet by mouth daily.     ibuprofen (ADVIL,MOTRIN) 600 MG tablet Take 1 tablet (600 mg total) by mouth every 8 (eight) hours as needed for pain. 60 tablet 1   Lancets (ONETOUCH ULTRASOFT) lancets   0   Lancets MISC by Does not apply route.     omeprazole (PRILOSEC) 40 MG capsule TAKE 1 CAPSULE(40 MG) BY MOUTH DAILY 30 capsule 3   ONE TOUCH ULTRA TEST test strip   0   Phenazopyridine HCl (AZO TABS PO) Take by mouth.     ramelteon (ROZEREM) 8 MG tablet Take 8 mg by mouth at bedtime.     No current facility-administered medications on file prior to visit.    Past Medical History:  Diagnosis Date   Arthritis    Colon polyps    Depression    Diabetes mellitus    TYPE 2   Hypertension    Osteoporosis 05/2019   T score -2.3.  Prior DEXA T score -2.6   Vitamin D deficiency      OBJECTIVE: Appears well, in no apparent distress.  Vital signs are normal. The abdomen is soft without tenderness, guarding, mass, rebound or organomegaly. No CVA tenderness or inguinal adenopathy noted. Urine dipstick shows positive for  WBC's, positive for RBC's, and positive for glucose.  Micro exam:  WBC's per HPF,  RBC's per HPF, and + bacteria.    Blood pressure 110/60, last menstrual period 06/09/1991.    Chaperone offered and declined for exam.  ASSESSMENT/PLAN: 1. Dysuria  - Urinalysis,Complete w/RFL Culture - estradiol (ESTRACE VAGINAL) 0.1 MG/GM vaginal cream; Place 1 Applicatorful vaginally 3 (three) times a week.  Dispense: 42.5 g; Refill: 12    Will send urine culture and sensitivity.  Treatment per orders - also push fluids, avoid bladder irritants. Instructed she may use Pyridium OTC prn. Call or return to clinic prn if these symptoms worsen or fail to improve as anticipated. Pyelo precautions reviewed with patient.

## 2022-08-15 LAB — CREATININE, SERUM: Creat: 1.38 mg/dL — ABNORMAL HIGH (ref 0.60–0.95)

## 2022-08-15 LAB — CALCIUM: Calcium: 10.3 mg/dL (ref 8.6–10.4)

## 2022-08-17 ENCOUNTER — Other Ambulatory Visit: Payer: Self-pay | Admitting: Nurse Practitioner

## 2022-08-17 DIAGNOSIS — R7989 Other specified abnormal findings of blood chemistry: Secondary | ICD-10-CM

## 2022-08-17 NOTE — Telephone Encounter (Signed)
Patient is due to have Reclast. Patient creatine level drawn on 08/14/2022 1.38.   I will route to provider, recommendations on how to proceed pertaining to  Relcast.

## 2022-08-17 NOTE — Telephone Encounter (Signed)
Have her increase her water intake as this could just be from some mild dehydration and return in 1 week for recheck. Thanks.

## 2022-08-19 ENCOUNTER — Encounter: Payer: Self-pay | Admitting: Nurse Practitioner

## 2022-08-26 NOTE — Telephone Encounter (Signed)
Pt understands recommendations. Appt scheduled

## 2022-09-01 ENCOUNTER — Encounter: Payer: Self-pay | Admitting: Gastroenterology

## 2022-09-03 ENCOUNTER — Other Ambulatory Visit: Payer: Medicare Other

## 2022-09-03 DIAGNOSIS — R7989 Other specified abnormal findings of blood chemistry: Secondary | ICD-10-CM

## 2022-09-03 LAB — URINALYSIS, COMPLETE W/RFL CULTURE
Bacteria, UA: NONE SEEN /HPF
Bilirubin Urine: NEGATIVE
Hgb urine dipstick: NEGATIVE
Hyaline Cast: NONE SEEN /LPF
Ketones, ur: NEGATIVE
Leukocyte Esterase: NEGATIVE
Nitrites, Initial: NEGATIVE
Protein, ur: NEGATIVE
Specific Gravity, Urine: 1.025 (ref 1.001–1.035)
pH: 6 (ref 5.0–8.0)

## 2022-09-03 LAB — URINE CULTURE
MICRO NUMBER:: 14018487
Result:: NO GROWTH
SPECIMEN QUALITY:: ADEQUATE

## 2022-09-03 LAB — CREATININE, SERUM: Creat: 1.13 mg/dL — ABNORMAL HIGH (ref 0.60–0.95)

## 2022-09-03 LAB — CULTURE INDICATED

## 2022-09-08 NOTE — Telephone Encounter (Signed)
Tamela Gammon, NP  09/07/2022  8:45 AM EDT Back to Top    OK to proceed with Reclast. Thanks.     AM per granddaughter

## 2022-09-24 ENCOUNTER — Other Ambulatory Visit (HOSPITAL_COMMUNITY): Payer: Self-pay

## 2022-09-24 DIAGNOSIS — M810 Age-related osteoporosis without current pathological fracture: Secondary | ICD-10-CM

## 2022-09-25 ENCOUNTER — Encounter (HOSPITAL_COMMUNITY)
Admission: RE | Admit: 2022-09-25 | Discharge: 2022-09-25 | Disposition: A | Discharge status: Home / Self Care | Payer: Medicare Other | Source: Ambulatory Visit | Attending: Nurse Practitioner | Admitting: Nurse Practitioner

## 2022-09-25 DIAGNOSIS — M810 Age-related osteoporosis without current pathological fracture: Secondary | ICD-10-CM | POA: Diagnosis not present

## 2022-09-25 LAB — CALCIUM: Calcium: 9.7 mg/dL (ref 8.9–10.3)

## 2022-09-25 MED ORDER — ZOLEDRONIC ACID 5 MG/100ML IV SOLN
5.0000 mg | Freq: Once | INTRAVENOUS | Status: AC
  Administered 2022-09-25: 5 mg via INTRAVENOUS

## 2022-09-25 MED ORDER — ZOLEDRONIC ACID 5 MG/100ML IV SOLN
INTRAVENOUS | Status: AC
  Filled 2022-09-25: qty 100

## 2023-06-15 NOTE — Progress Notes (Unsigned)
SUBJECTIVE: Mary Hunt is a 82 y.o. female  here with Spanish interpreter who complains of vaginal itching, burning, no discharge, urinary frequency. Symptoms began 2 months ago. Using Estrogen vaginal cream without relief. Symptoms worse at night.   Allergies  Allergen Reactions   Statins     Elevated liver enzymes   Penicillins Rash    REACTION: itching    Current Outpatient Medications on File Prior to Visit  Medication Sig Dispense Refill   CALCIUM PO Take by mouth.     carvedilol (COREG) 6.25 MG tablet Take by mouth.     Cholecalciferol (VITAMIN D-3 PO) Take by mouth.     clobetasol ointment (TEMOVATE) 0.05 % Apply 1 application. topically 2 (two) times daily. 30 g 0   Dapagliflozin-metFORMIN HCl ER (XIGDUO XR) 08-999 MG TB24 Take by mouth.     estradiol (ESTRACE VAGINAL) 0.1 MG/GM vaginal cream Place 1 Applicatorful vaginally 3 (three) times a week. 42.5 g 12   ezetimibe (ZETIA) 10 MG tablet Take 1 tablet by mouth daily.     fenofibrate (TRICOR) 145 MG tablet Take 1 tablet by mouth daily.     ibuprofen (ADVIL,MOTRIN) 600 MG tablet Take 1 tablet (600 mg total) by mouth every 8 (eight) hours as needed for pain. 60 tablet 1   Lancets (ONETOUCH ULTRASOFT) lancets   0   Lancets MISC by Does not apply route.     Multiple Vitamin (MULTIVITAMIN PO) Take by mouth.     Omega-3 Fatty Acids (FISH OIL PO) Take by mouth.     omeprazole (PRILOSEC) 40 MG capsule TAKE 1 CAPSULE(40 MG) BY MOUTH DAILY 30 capsule 3   ONE TOUCH ULTRA TEST test strip   0   candesartan (ATACAND) 16 MG tablet Take by mouth. (Patient not taking: Reported on 06/16/2023)     Phenazopyridine HCl (AZO TABS PO) Take by mouth. (Patient not taking: Reported on 06/16/2023)     ramelteon (ROZEREM) 8 MG tablet Take 8 mg by mouth at bedtime. (Patient not taking: Reported on 06/16/2023)     sulfamethoxazole-trimethoprim (BACTRIM DS) 800-160 MG tablet Take 1 tablet by mouth 2 (two) times daily. (Patient not taking: Reported on  06/16/2023) 10 tablet 0   No current facility-administered medications on file prior to visit.    Past Medical History:  Diagnosis Date   Arthritis    Colon polyps    Depression    Diabetes mellitus    TYPE 2   Hypertension    Osteoporosis 05/2019   T score -2.3.  Prior DEXA T score -2.6   Vitamin D deficiency      OBJECTIVE:Physical Exam Vitals and nursing note reviewed. Exam conducted with a chaperone present.  Constitutional:      Appearance: Normal appearance. She is normal weight.  Genitourinary:    Labia:        Right: Rash present.        Left: Rash present.      Vagina: Erythema present. No vaginal discharge or tenderness.  Neurological:     Mental Status: She is alert.      Microscopic wet-mount exam shows negative for pathogens, normal epithelial cells  Urine dipstick shows positive for glucose.  Micro exam: 6-10 WBC's per HPF. Marland Kitchen    Blood pressure 130/74, last menstrual period 06/09/1991.   Raynelle Fanning, CMA present for exam  ASSESSMENT/PLAN: 1. Urinary frequency - Urinalysis,Complete w/RFL Culture  2. Acute vulvitis - clobetasol ointment (TEMOVATE) 0.05 %; Apply 1 Application topically 2 (  two) times daily.  Dispense: 30 g; Refill: 0  3. Dysuria  4. Atrophic vulvovaginitis - clobetasol ointment (TEMOVATE) 0.05 %; Apply 1 Application topically 2 (two) times daily.  Dispense: 30 g; Refill: 0 - estradiol (ESTRACE VAGINAL) 0.1 MG/GM vaginal cream; Place 1 g vaginally 3 (three) times a week. Apply a thin layer (pea sized amount) to the vulva as well  Dispense: 42.5 g; Refill: 12

## 2023-06-16 ENCOUNTER — Telehealth: Payer: Self-pay

## 2023-06-16 ENCOUNTER — Ambulatory Visit (INDEPENDENT_AMBULATORY_CARE_PROVIDER_SITE_OTHER): Payer: 59 | Admitting: Radiology

## 2023-06-16 VITALS — BP 130/74

## 2023-06-16 DIAGNOSIS — N952 Postmenopausal atrophic vaginitis: Secondary | ICD-10-CM | POA: Diagnosis not present

## 2023-06-16 DIAGNOSIS — R300 Dysuria: Secondary | ICD-10-CM | POA: Diagnosis not present

## 2023-06-16 DIAGNOSIS — M810 Age-related osteoporosis without current pathological fracture: Secondary | ICD-10-CM

## 2023-06-16 DIAGNOSIS — R350 Frequency of micturition: Secondary | ICD-10-CM

## 2023-06-16 DIAGNOSIS — N762 Acute vulvitis: Secondary | ICD-10-CM | POA: Diagnosis not present

## 2023-06-16 LAB — URINALYSIS, COMPLETE W/RFL CULTURE
Bilirubin Urine: NEGATIVE
Hgb urine dipstick: NEGATIVE
Hyaline Cast: NONE SEEN /LPF
Ketones, ur: NEGATIVE
Leukocyte Esterase: NEGATIVE
Nitrites, Initial: NEGATIVE
Protein, ur: NEGATIVE
RBC / HPF: NONE SEEN /HPF (ref 0–2)
Specific Gravity, Urine: 1.02 (ref 1.001–1.035)
pH: 5 (ref 5.0–8.0)

## 2023-06-16 LAB — CULTURE INDICATED

## 2023-06-16 MED ORDER — CLOBETASOL PROPIONATE 0.05 % EX OINT
1.0000 | TOPICAL_OINTMENT | Freq: Two times a day (BID) | CUTANEOUS | 0 refills | Status: AC
Start: 2023-06-16 — End: ?

## 2023-06-16 MED ORDER — ESTRADIOL 0.1 MG/GM VA CREA
1.0000 g | TOPICAL_CREAM | VAGINAL | 12 refills | Status: DC
Start: 2023-06-16 — End: 2023-10-12

## 2023-06-16 NOTE — Telephone Encounter (Signed)
-----   Message from [place] V sent at 06/16/2023  4:11 PM EDT ----- Pt in for an appt today and granddaughter  has additional questions in regards to reclast and lab orders.

## 2023-06-17 ENCOUNTER — Other Ambulatory Visit: Payer: Self-pay | Admitting: Radiology

## 2023-06-17 ENCOUNTER — Other Ambulatory Visit: Payer: Self-pay | Admitting: *Deleted

## 2023-06-17 DIAGNOSIS — N762 Acute vulvitis: Secondary | ICD-10-CM

## 2023-06-17 LAB — WET PREP FOR TRICH, YEAST, CLUE

## 2023-06-18 NOTE — Telephone Encounter (Signed)
Tiffany -please review. Last Reclast infusion received 09/25/22. AEX 04/14/22. Patient has been seen for 2 problem visits with Jami since last AEX. Will need updated calcium within 30 days. Last BMD 05/20/21, repeat 2 years.   Please advise.

## 2023-06-18 NOTE — Telephone Encounter (Signed)
LVMTCB

## 2023-06-18 NOTE — Telephone Encounter (Signed)
Spoke w/ granddaughter (karen) per DPR to see what the questions were and she is inquiring since pt's last reclast infusion was received in 07/2022, when do they need to have her lab levels rechecked, next infusion scheduled, and next DEXA?  Clydie Braun advised that DEXA could be advised by provider to be done at a later date since not currently offering in office but do plan too soon so that we will be able to compare from pt's last scan and how she is doing with her treatment plan. Advised her that I will forward inquiry to coordinator for reclast so that process could get started for pt.   FYI.  TW pt. Last AEX 04/14/2022; recall placed for 04/2024. Last reclast infusion 09/25/2022  Calcium/Creatinine levels checked on 01/14/2023.

## 2023-06-22 NOTE — Telephone Encounter (Signed)
Please place recall for DXA for when it is up and running again. Will plan for Reclast in November as long as bone density is stable. Thanks.

## 2023-06-24 NOTE — Telephone Encounter (Signed)
Call returned to patients Grand-daughter. Left message to call Noreene Larsson, RN at [facility], [phone], option 5.   BMD recall placed for 09/2023

## 2023-07-09 NOTE — Telephone Encounter (Signed)
 Left message to call [facility] Triage at [phone], option 4.

## 2023-07-15 NOTE — Telephone Encounter (Signed)
Spoke with patients granddaughter, Clydie Braun, ok per dpr. Advised per Tiffany. Recall previously placed, advised can get labs same day as BMD when scheduling. Clydie Braun will return call to schedule BMD and labs.   Routing to provider for final review. Will close encounter.  Cc: Petra Kuba.

## 2023-09-10 ENCOUNTER — Ambulatory Visit
Admission: EM | Admit: 2023-09-10 | Discharge: 2023-09-10 | Disposition: A | Discharge status: Home / Self Care | Payer: 59 | Attending: Family Medicine | Admitting: Family Medicine

## 2023-09-10 DIAGNOSIS — H9313 Tinnitus, bilateral: Secondary | ICD-10-CM | POA: Diagnosis not present

## 2023-09-10 DIAGNOSIS — H8113 Benign paroxysmal vertigo, bilateral: Secondary | ICD-10-CM

## 2023-09-10 DIAGNOSIS — H9193 Unspecified hearing loss, bilateral: Secondary | ICD-10-CM | POA: Diagnosis not present

## 2023-09-10 LAB — POCT URINALYSIS DIP (MANUAL ENTRY)
Bilirubin, UA: NEGATIVE
Blood, UA: NEGATIVE
Glucose, UA: >=1000 mg/dL — AB
Ketones, POC UA: NEGATIVE mg/dL
Leukocytes, UA: NEGATIVE
Nitrite, UA: NEGATIVE
Protein Ur, POC: NEGATIVE mg/dL
Spec Grav, UA: 1.02 (ref 1.010–1.025)
Urobilinogen, UA: 0.2 U/dL
pH, UA: 5.5 (ref 5.0–8.0)

## 2023-09-10 LAB — POCT FASTING CBG KUC MANUAL ENTRY: POCT Glucose (KUC): 175 mg/dL — AB (ref 70–99)

## 2023-09-10 MED ORDER — MECLIZINE HCL 25 MG PO TABS: 25.0000 mg | ORAL_TABLET | Freq: Two times a day (BID) | ORAL | 0 refills | Status: AC | PRN

## 2023-09-10 MED ORDER — ONDANSETRON 4 MG PO TBDP: 4.0000 mg | ORAL_TABLET | Freq: Three times a day (TID) | ORAL | 0 refills | Status: AC | PRN

## 2023-09-10 NOTE — ED Triage Notes (Signed)
Pt present with c/o dizziness,  nausea, intermittent headaches x 10 days.   Pt has taken OTC med. Has not had any relief.

## 2023-09-10 NOTE — Discharge Instructions (Signed)
Symptoms worsen or headache returns with dizziness go immediately to the emergency department.  If your symptoms have not improved with in 3 days with medications prescribed by meetly to the emergency department.

## 2023-09-10 NOTE — ED Provider Notes (Signed)
[facility]    CSN: [id number] Arrival date & time: 09/10/23  8295      History   Chief Complaint Chief Complaint  Patient presents with   Headache   Dizziness   Nausea   Ear Pain  . Staff Interpreter utilized during Audiological scientist.  HPI Mary Hunt is a 82 y.o. female.  Patient with history of hypertension type, type 2 diabetes, arthritis and diabetes presents today with 10 days of ringing in the ears,  intermittent headache and nausea, dizziness.  She has been taking over-the-counter medication for headache pain  without improvement and has recently seen her primary care doctor and had a full panel of labs as of 08/04/2023 which were reviewed and all were stable.  Patient reports today reports symptoms of feeling that the room is spinning and hearing ringing in her ear. Endorses intermittent headache since the onset of dizziness. Denies changes in vision, weakness or current headache. She has not had episodes of chest pain or shortness of breath. She has checked her blood sugars and readings have remained at baseline readings. Recent A1C 6.8. Patient endorses recent diminished hearing and is concerned that ear may need to be flushed. She has taken OTC medication for headache relief although had a headache yesterday. Past Medical History:  Diagnosis Date   Arthritis    Colon polyps    Depression    Diabetes mellitus    TYPE 2   Hypertension    Osteoporosis 05/2019   T score -2.3.  Prior DEXA T score -2.6   Vitamin D deficiency     Patient Active Problem List   Diagnosis Date Noted   Pain due to onychomycosis of toenails of both feet 03/13/2021   Hav (hallux abducto valgus), right 03/13/2021   Hammer toe of second toe of right foot 03/13/2021   Esophageal dysphagia 07/07/2019   Atypical chest pain 07/07/2019   Unintentional weight loss 07/07/2019   History of colonic polyps 07/07/2019   History of Abnormal LFTs 07/07/2019   Gastroesophageal reflux disease 07/07/2019    Age-related osteoporosis without current pathological fracture 02/12/2017   Vitamin D deficiency 12/31/2016   Vaginal atrophy 02/01/2014   Psoriasis 10/12/2012   Internal hemorrhoids 12/06/2008   RECTAL BLEEDING 12/06/2008   COLONIC POLYPS 12/03/2008   Essential hypertension 12/03/2008   External hemorrhoids 12/03/2008   Diverticulosis of colon 12/03/2008   Arthropathy 12/03/2008   NONSPEC ELEVATION OF LEVELS OF TRANSAMINASE/LDH 12/03/2008    Past Surgical History:  Procedure Laterality Date   APPENDECTOMY     BREAST SURGERY     BREAST BIOPSY-BENIGN    OB History     Gravida  3   Para  3   Term  3   Preterm      AB  0   Living  2      SAB      IAB      Ectopic  0   Multiple      Live Births  3            Home Medications    Prior to Admission medications   Medication Sig Start Date End Date Taking? Authorizing Provider  meclizine (ANTIVERT) 25 MG tablet Take 1 tablet (25 mg total) by mouth 2 (two) times daily as needed for dizziness. 09/10/23  Yes Bing Neighbors, NP  ondansetron (ZOFRAN-ODT) 4 MG disintegrating tablet Take 1 tablet (4 mg total) by mouth every 8 (eight) hours as needed for nausea or vomiting.  09/10/23  Yes Bing Neighbors, NP  CALCIUM PO Take by mouth.    [provider]  candesartan (ATACAND) 16 MG tablet Take by mouth. Patient not taking: Reported on 06/16/2023 03/11/22   [provider]  carvedilol (COREG) 6.25 MG tablet Take by mouth. 06/29/22   [provider]  Cholecalciferol (VITAMIN D-3 PO) Take by mouth.    [provider]  clobetasol ointment (TEMOVATE) 0.05 % Apply 1 Application topically 2 (two) times daily. 06/16/23   Chrzanowski, Jami B, NP  Dapagliflozin-metFORMIN HCl ER (XIGDUO XR) 08-999 MG TB24 Take by mouth. 01/09/22   [provider]  estradiol (ESTRACE VAGINAL) 0.1 MG/GM vaginal cream Place 1 g vaginally 3 (three) times a week. Apply a thin layer (pea sized amount) to  the vulva as well 06/16/23   Chrzanowski, Jami B, NP  ezetimibe (ZETIA) 10 MG tablet Take 1 tablet by mouth daily. 06/29/22   [provider]  fenofibrate (TRICOR) 145 MG tablet Take 1 tablet by mouth daily. 06/29/22   [provider]  ibuprofen (ADVIL,MOTRIN) 600 MG tablet Take 1 tablet (600 mg total) by mouth every 8 (eight) hours as needed for pain. 10/12/12   Ok Edwards, MD  Lancets Letta Pate ULTRASOFT) lancets  12/19/16   [provider]  Lancets MISC by Does not apply route. 12/17/16   [provider]  Multiple Vitamin (MULTIVITAMIN PO) Take by mouth.    [provider]  Omega-3 Fatty Acids (FISH OIL PO) Take by mouth.    [provider]  omeprazole (PRILOSEC) 40 MG capsule TAKE 1 CAPSULE(40 MG) BY MOUTH DAILY 11/13/19   Mansouraty, Netty Starring., MD  ONE TOUCH ULTRA TEST test strip  12/19/16   [provider]  Phenazopyridine HCl (AZO TABS PO) Take by mouth. Patient not taking: Reported on 06/16/2023    [provider]  ramelteon (ROZEREM) 8 MG tablet Take 8 mg by mouth at bedtime. Patient not taking: Reported on 06/16/2023 08/13/22   [provider]  sulfamethoxazole-trimethoprim (BACTRIM DS) 800-160 MG tablet Take 1 tablet by mouth 2 (two) times daily. Patient not taking: Reported on 06/16/2023 08/14/22   Tanda Rockers, NP    Family History Family History  Problem Relation Age of Onset   Cancer Mother        stomach   Stomach cancer Mother    Breast cancer Sister    Breast cancer Maternal Aunt    Colon cancer Neg Hx    Esophageal cancer Neg Hx    Inflammatory bowel disease Neg Hx    Liver disease Neg Hx    Pancreatic cancer Neg Hx    Rectal cancer Neg Hx    Osteoporosis Neg Hx     Social History Social History   Tobacco Use   Smoking status: Never   Smokeless tobacco: Never  Vaping Use   Vaping status: Never Used  Substance Use Topics   Alcohol use: No   Drug use: No     Allergies    Statins and Penicillins   Review of Systems Review of Systems  Neurological:  Positive for dizziness and headaches.     Physical Exam Triage Vital Signs ED Triage Vitals  Encounter Vitals Group     BP 09/10/23 1850 (!) 149/76     Systolic BP Percentile --      Diastolic BP Percentile --      Pulse Rate 09/10/23 1850 61     Resp 09/10/23 1850 17  Temp 09/10/23 1850 97.6 F (36.4 C)     Temp Source 09/10/23 1850 Oral     SpO2 09/10/23 1850 96 %     Weight --      Height --      Head Circumference --      Peak Flow --      Pain Score 09/10/23 1849 8     Pain Loc --      Pain Education --      Exclude from Growth Chart --    No data found.  Updated Vital Signs BP (!) 149/76 (BP Location: Right Arm)   Pulse 61   Temp 97.6 F (36.4 C) (Oral)   Resp 17   LMP 06/09/1991   SpO2 96%   Visual Acuity Right Eye Distance:   Left Eye Distance:   Bilateral Distance:    Right Eye Near:   Left Eye Near:    Bilateral Near:     Physical Exam Vitals reviewed.  Constitutional:      General: She is not in acute distress.    Appearance: She is well-developed. She is not ill-appearing.  HENT:     Head: Normocephalic and atraumatic.     Right Ear: Tympanic membrane, ear canal and external ear normal. Decreased hearing noted.     Left Ear: Tympanic membrane, ear canal and external ear normal. Decreased hearing noted.     Mouth/Throat:     Mouth: Mucous membranes are moist.     Pharynx: Oropharynx is clear.  Eyes:     Extraocular Movements: Extraocular movements intact.     Pupils: Pupils are equal, round, and reactive to light.  Cardiovascular:     Rate and Rhythm: Normal rate and regular rhythm.  Pulmonary:     Effort: Pulmonary effort is normal.     Breath sounds: Normal breath sounds.  Musculoskeletal:     Cervical back: Normal range of motion and neck supple.  Skin:    General: Skin is warm and dry.     Capillary Refill: Capillary refill takes less than 2  seconds.  Neurological:     General: No focal deficit present.     Mental Status: She is alert and oriented to person, place, and time.     Cranial Nerves: No cranial nerve deficit.     Sensory: No sensory deficit.     Motor: No weakness.     Coordination: Coordination normal.     Gait: Gait normal.      UC Treatments / Results  Labs (all labs ordered are listed, but only abnormal results are displayed) Labs Reviewed  POCT FASTING CBG [facility] MANUAL ENTRY - Abnormal; Notable for the following components:      Result Value   POCT Glucose ([facility]) 175 (*)    All other components within normal limits  POCT URINALYSIS DIP (MANUAL ENTRY) - Abnormal; Notable for the following components:   Glucose, UA >=1,000 (*)    All other components within normal limits    EKG   Radiology No results found.  Procedures Procedures (including critical care time)  Medications Ordered in UC Medications - No data to display  Initial Impression / Assessment and Plan / UC Course  I have reviewed the triage vital signs and the nursing notes.  Pertinent labs & imaging results that were available during my care of the patient were reviewed by me and considered in my medical decision making (see chart for details).   1. Benign paroxysmal positional  vertigo due to bilateral vestibular disorder - POCT CBG (manual entry); 175 Glucose, slightly elevated, non fasting reading in known patient with type 2 diabetes.   - POCT urinalysis dipstick; unremarkable for UTI   Dizziness and nausea, associated with vertigo, will trial:  - meclizine (ANTIVERT) 25 MG tablet; Take 1 tablet (25 mg total) by mouth 2 (two) times daily as needed for dizziness.  Dispense: 30 tablet; Refill: 0 - ondansetron (ZOFRAN-ODT) 4 MG disintegrating tablet; Take 1 tablet (4 mg total) by mouth every 8 (eight) hours as needed for nausea or vomiting.  Dispense: 20 tablet; Refill: 0  2. Bilateral hearing loss, unspecified hearing loss type,  new suspect tinnitus related to changes in hearing. Follow-up with audiology for hearing testing   3. 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 Follow-up with audiology for hearing testing    Strict ED precautions given if symptoms worsen or does not improve with treatment recommendations. Final Clinical Impressions(s) / UC Diagnoses   Final diagnoses:  Benign paroxysmal positional vertigo due to bilateral vestibular disorder  Bilateral hearing loss, unspecified hearing loss type  Tinnitus, bilateral     Discharge Instructions      Symptoms worsen or headache returns with dizziness go immediately to the emergency department.  If your symptoms have not improved with in 3 days with medications prescribed by meetly to the emergency department.     ED Prescriptions     Medication Sig Dispense Auth. Provider   meclizine (ANTIVERT) 25 MG tablet Take 1 tablet (25 mg total) by mouth 2 (two) times daily as needed for dizziness. 30 tablet Bing Neighbors, NP   ondansetron (ZOFRAN-ODT) 4 MG disintegrating tablet Take 1 tablet (4 mg total) by mouth every 8 (eight) hours as needed for nausea or vomiting. 20 tablet Bing Neighbors, NP      PDMP not reviewed this encounter.   Bing Neighbors, NP 09/12/23 1101

## 2023-10-11 ENCOUNTER — Other Ambulatory Visit: Payer: Self-pay | Admitting: Radiology

## 2023-10-11 DIAGNOSIS — N952 Postmenopausal atrophic vaginitis: Secondary | ICD-10-CM

## 2023-10-11 DIAGNOSIS — R300 Dysuria: Secondary | ICD-10-CM

## 2023-10-12 ENCOUNTER — Other Ambulatory Visit: Payer: Self-pay | Admitting: *Deleted

## 2023-10-12 DIAGNOSIS — Z780 Asymptomatic menopausal state: Secondary | ICD-10-CM

## 2023-10-12 DIAGNOSIS — M810 Age-related osteoporosis without current pathological fracture: Secondary | ICD-10-CM

## 2023-10-12 NOTE — Telephone Encounter (Signed)
Med refill request: estradiol 0.1 mg vaginal cream Last AEX: 04/14/22 Last OV: 06/16/23 Next AEX: none Last MMG (if hormonal med) 08/19/22 Refill sent to provider for approval or denial as appropriate.

## 2023-10-22 ENCOUNTER — Ambulatory Visit (HOSPITAL_BASED_OUTPATIENT_CLINIC_OR_DEPARTMENT_OTHER)
Admission: RE | Admit: 2023-10-22 | Discharge: 2023-10-22 | Disposition: A | Discharge status: Home / Self Care | Payer: 59 | Source: Ambulatory Visit | Attending: Nurse Practitioner

## 2023-10-22 DIAGNOSIS — M810 Age-related osteoporosis without current pathological fracture: Secondary | ICD-10-CM | POA: Diagnosis present

## 2023-10-22 DIAGNOSIS — Z780 Asymptomatic menopausal state: Secondary | ICD-10-CM | POA: Insufficient documentation

## 2023-10-22 NOTE — Telephone Encounter (Signed)
Pt's granddaughter Clydie Braun) on DPR LVM in triage line stating that the pt just got her DXA performed today and would like to go ahead and schedule lab visit for calcium at our/their earliest convenience.  Ok to schedule today or early next week? (**Reminder, also need to place the lab order)  TIA.

## 2023-10-25 NOTE — Telephone Encounter (Signed)
Spoke with Clydie Braun, ok per dpr. Clydie Braun states grandmother is due for Reclast, inquiring about labs. Advised I will forward to Irving Burton C to f/u on Reclast and can further advise on labs needed when she returns call. Clydie Braun verbalizes understanding.    Tiffany -please review BMD dated 10/22/23, ok to proceed with Reclast?    Cc: Petra Kuba

## 2023-10-25 NOTE — Telephone Encounter (Signed)
Yes, please proceed with Reclast. Thanks.

## 2023-10-25 NOTE — Telephone Encounter (Signed)
Routing to [employer].

## 2023-10-25 NOTE — Telephone Encounter (Signed)
Calcium with PCP in September was 9.7. No need to repeat.

## 2023-10-26 ENCOUNTER — Telehealth: Payer: Self-pay

## 2023-10-26 ENCOUNTER — Telehealth: Payer: Self-pay | Admitting: *Deleted

## 2023-10-26 NOTE — Addendum Note (Signed)
Addended by: Arnold Long T on: 10/26/2023 08:16 AM   Modules accepted: Orders

## 2023-10-26 NOTE — Telephone Encounter (Signed)
Call to patient's granddaugher, Clydie Braun, okay to speak with per DPR. Advised will need labs 30 days prior to reclast infusion. Agreeable to schedule. Patient scheduled for labs on 10-29-23 at 0820. Orders placed. Advised once labs back could fax [facility] to [facility] and schedule infusion. Patient's granddaughter agreeable.

## 2023-10-26 NOTE — Telephone Encounter (Signed)
Reclast instructions   Annual Exam (1 year) ?  Called pt to verify insurance coverage / inform pt Reclast is in Process ?  Labs must be in 30 days window Serum Creatinine DATE? Scheduled 10-29-23  Serum Calcium DATE? Scheduled 10-29-23    PA needed? no  Cost for Pt? Estimated $24.35  Order form filled out and faxed  w/MD sig?  Infusion will be done at ? [facility]   Pt aware?  Instructions mailed to pt? \

## 2023-10-26 NOTE — Telephone Encounter (Signed)
-----   Message from [place] [facility] sent at 10/26/2023  1:09 PM EST ----- Regarding: Voicemail left The patient has some questions about her labs on Friday. Please call her at [phone].   Thank you, Thomes Cake

## 2023-10-26 NOTE — Telephone Encounter (Signed)
Spoke with May, Engineer, manufacturing at [employer]. Patient has 20% coinsurance for both medication and administration. No Prior Authorization is required. Reference # for call: [id number].

## 2023-10-27 NOTE — Telephone Encounter (Signed)
Spoke w/ pt's grand daughter Clydie Braun) per [facility] and she advised me that pt will be out of town until 12/02/2023 and is aware that her labs are only good within 30 days prior to getting her reclast infusion. So, if the pt has her labs done on currently scheduled date for 10/29/23-it will likely be over the 30 days when the pt is scheduled for her infusion.   She was advised to just wait and have pt get her labs done when she returns. Pt now scheduled for lab visit only on 12/02/2023 @ 9am.  Please advise if anything additional on this. Thanks.

## 2023-10-29 ENCOUNTER — Other Ambulatory Visit: Payer: 59

## 2023-11-16 NOTE — Telephone Encounter (Signed)
 Patient scheduled for labs on 12-02-23. Will await results before faxing Reclast order.

## 2023-12-02 ENCOUNTER — Other Ambulatory Visit: Payer: 59

## 2023-12-02 DIAGNOSIS — M810 Age-related osteoporosis without current pathological fracture: Secondary | ICD-10-CM

## 2023-12-03 LAB — CREATININE, SERUM: Creat: 0.84 mg/dL (ref 0.60–0.95)

## 2023-12-03 LAB — CALCIUM: Calcium: 9.6 mg/dL (ref 8.6–10.4)

## 2023-12-07 NOTE — Telephone Encounter (Signed)
Call to [facility], spoke with June, Engineer, manufacturing. Patient responsibility is 20% coinsurance. No PA required. Reference #: [id number]. Dual coverage with medicaid.   Reclast instructions   Annual Exam (1 year) ?  Called pt to verify insurance coverage / inform pt Reclast is in Process ? yes  Labs must be in 30 days window Serum Creatinine DATE? 0.84 on 12/02/23 Serum Calcium DATE? 9.6 on 12/02/23    PA needed? No  Cost for Pt? $0 dual coverage of UHC medicare, medicare and medicaid   Order form filled out and faxed  w/MD sig? Yes, faxed to [facility] at [phone].  Infusion will be done at ? [facility]  Pt aware? Yes, spoke with patient's granddaughter, Clydie Braun.   Instructions mailed to pt? Yes, address on file confirmed.   Patient scheduled for 12/13/23 at 0800.

## 2023-12-10 ENCOUNTER — Other Ambulatory Visit (HOSPITAL_COMMUNITY): Payer: Self-pay | Admitting: *Deleted

## 2023-12-13 ENCOUNTER — Encounter (HOSPITAL_COMMUNITY)
Admission: RE | Admit: 2023-12-13 | Discharge: 2023-12-13 | Disposition: A | Discharge status: Home / Self Care | Payer: 59 | Source: Ambulatory Visit | Attending: Nurse Practitioner | Admitting: Nurse Practitioner

## 2023-12-13 DIAGNOSIS — M810 Age-related osteoporosis without current pathological fracture: Secondary | ICD-10-CM | POA: Diagnosis present

## 2023-12-13 MED ORDER — ZOLEDRONIC ACID 5 MG/100ML IV SOLN
INTRAVENOUS | Status: AC
  Administered 2023-12-13: 5 mg via INTRAVENOUS
  Filled 2023-12-13: qty 100

## 2023-12-13 MED ORDER — ZOLEDRONIC ACID 5 MG/100ML IV SOLN: 5.0000 mg | Freq: Once | INTRAVENOUS | Status: AC

## 2023-12-21 NOTE — Telephone Encounter (Signed)
Patient received Reclast infusion on 12/13/23. Encounter closed.

## 2024-07-20 ENCOUNTER — Emergency Department (HOSPITAL_COMMUNITY): Admission: EM | Admit: 2024-07-20 | Discharge: 2024-07-20 | Disposition: A | Discharge status: Home / Self Care

## 2024-07-20 ENCOUNTER — Ambulatory Visit
Admission: EM | Admit: 2024-07-20 | Discharge: 2024-07-20 | Disposition: A | Discharge status: Home / Self Care | Attending: Physician Assistant | Admitting: Physician Assistant

## 2024-07-20 ENCOUNTER — Emergency Department (HOSPITAL_COMMUNITY)

## 2024-07-20 ENCOUNTER — Encounter (HOSPITAL_COMMUNITY): Payer: Self-pay

## 2024-07-20 ENCOUNTER — Other Ambulatory Visit: Payer: Self-pay

## 2024-07-20 DIAGNOSIS — S01111A Laceration without foreign body of right eyelid and periocular area, initial encounter: Secondary | ICD-10-CM

## 2024-07-20 DIAGNOSIS — M25551 Pain in right hip: Secondary | ICD-10-CM | POA: Diagnosis not present

## 2024-07-20 DIAGNOSIS — S0990XA Unspecified injury of head, initial encounter: Secondary | ICD-10-CM | POA: Insufficient documentation

## 2024-07-20 DIAGNOSIS — G44319 Acute post-traumatic headache, not intractable: Secondary | ICD-10-CM | POA: Diagnosis not present

## 2024-07-20 DIAGNOSIS — W01198A Fall on same level from slipping, tripping and stumbling with subsequent striking against other object, initial encounter: Secondary | ICD-10-CM | POA: Insufficient documentation

## 2024-07-20 DIAGNOSIS — W19XXXA Unspecified fall, initial encounter: Secondary | ICD-10-CM

## 2024-07-20 MED ORDER — ACETAMINOPHEN 325 MG PO TABS
650.0000 mg | ORAL_TABLET | Freq: Once | ORAL | Status: AC
  Administered 2024-07-20: 650 mg via ORAL

## 2024-07-20 NOTE — ED Triage Notes (Signed)
 Pt presents to ED from UC C/O fall this morning at home resulting in laceration above L eye, repaired at UC with steri-strips. Sent by UC for head CT. Endorses headache since fall. Denies blood thinner use, dizziness, LOC, n/v.

## 2024-07-20 NOTE — Discharge Instructions (Addendum)
 You were seen today for concerns of a headache as well as a laceration following a fall.  At urgent care we are not able to provide advanced imaging such as a CT scan to ensure that you are not having internal bleeding.  Given the fact that you are having a severe headache I do recommend that you go to the emergency room for evaluation with advanced imaging or call your PCP to order this to make sure that you do not have any bleeding ongoing.  We have provided you with a dose of Tylenol  to help with your pain as well as Steri-Strips to close the laceration to your forehead. The Steri-Strips should come off on their own in the next 5 to 7 days.  Please refrain from getting the area wet until tomorrow morning.  You can wash the area with warm water and gentle soap but do not scrub the area or apply harsh cleanser such as alcohol or peroxide. If you notice any of the following please return urgent care: Drainage that looks like pus, swelling, redness spreading from the laceration site, profuse bleeding.  With regards to your fall and head injury if you notice any of the following please go to the emergency room: Confusion, loss of consciousness, vision changes, severe headache, nausea or vomiting, weakness or numbness on one side of the body, facial drooping, difficulty speaking

## 2024-07-20 NOTE — Discharge Instructions (Signed)
 For pain, you can take 1000 mg of Tylenol  or 1 g of Tylenol  every 6-8 hours.  Do not exceed more than 4000 mg or 4 g in a 24-hour period.    There was some chronic changes on the CT scan.  There is no acute injury.  She can follow-up with PCP regarding chronic findings but there is nothing to do at this point time.

## 2024-07-20 NOTE — ED Provider Notes (Signed)
 [place] [facility] AT [facility] Provider Note   CSN: [id number] Arrival date & time: 07/20/24  1546     Patient presents with: Mary Hunt is a 83 y.o. female.    Fall Pertinent negatives include no chest pain, no abdominal pain and no shortness of breath.   Presents  because of fall.  According to family, patient fell today.  Tripped over a toy on the floor.  Hit her head.  Did not lose consciousness.  Endorsing forehead pain as well as mild headache.  No cervical thoracic or lumbar pain.  No anticoagulation.  No chest pain or shortness of breath before or after event.  Endorses some slight right hip pain but very minimal in nature and able to walk on it.  No numbness or tingling anywhere.  To urgent care.  Patient had small laceration on forehead.  Steri-Strips placed.      Prior to Admission medications   Medication Sig Start Date End Date Taking? Authorizing Provider  CALCIUM PO Take by mouth.    [provider]  candesartan (ATACAND) 16 MG tablet Take by mouth. Patient not taking: Reported on 06/16/2023 03/11/22   [provider]  carvedilol (COREG) 6.25 MG tablet Take by mouth. 06/29/22   [provider]  Cholecalciferol (VITAMIN D -3 PO) Take by mouth.    [provider]  clobetasol  ointment (TEMOVATE ) 0.05 % Apply 1 Application topically 2 (two) times daily. 06/16/23   Chrzanowski, Jami B, NP  Dapagliflozin-metFORMIN HCl ER (XIGDUO XR) 08-999 MG TB24 Take by mouth. 01/09/22   [provider]  estradiol  (ESTRACE ) 0.1 MG/GM vaginal cream INSERT 1 APPLICATORFUL VAGINALLY 3 TIMES A WEEK 10/12/23   Chrzanowski, Jami B, NP  ezetimibe (ZETIA) 10 MG tablet Take 1 tablet by mouth daily. 06/29/22   [provider]  fenofibrate (TRICOR) 145 MG tablet Take 1 tablet by mouth daily. 06/29/22   [provider]  ibuprofen  (ADVIL ,MOTRIN ) 600 MG tablet Take 1 tablet (600 mg total) by mouth every 8 (eight) hours  as needed for pain. 10/12/12   Winfred Curlee DEL, MD  Lancets JANETT ULTRASOFT) lancets  12/19/16   [provider]  Lancets MISC by Does not apply route. 12/17/16   [provider]  meclizine  (ANTIVERT ) 25 MG tablet Take 1 tablet (25 mg total) by mouth 2 (two) times daily as needed for dizziness. 09/10/23   Arloa Suzen RAMAN, NP  Multiple Vitamin (MULTIVITAMIN PO) Take by mouth.    [provider]  Omega-3 Fatty Acids (FISH OIL PO) Take by mouth.    [provider]  omeprazole  (PRILOSEC) 40 MG capsule TAKE 1 CAPSULE(40 MG) BY MOUTH DAILY 11/13/19   Mansouraty, Gabriel Jr., MD  ondansetron  (ZOFRAN -ODT) 4 MG disintegrating tablet Take 1 tablet (4 mg total) by mouth every 8 (eight) hours as needed for nausea or vomiting. 09/10/23   Arloa Suzen RAMAN, NP  ONE TOUCH ULTRA TEST test strip  12/19/16   [provider]  Phenazopyridine HCl (AZO TABS PO) Take by mouth. Patient not taking: Reported on 06/16/2023    [provider]  ramelteon (ROZEREM) 8 MG tablet Take 8 mg by mouth at bedtime. Patient not taking: Reported on 06/16/2023 08/13/22   [provider]  sulfamethoxazole -trimethoprim  (BACTRIM  DS) 800-160 MG tablet Take 1 tablet by mouth 2 (two) times daily. Patient not taking: Reported on 06/16/2023 08/14/22   Chrzanowski, Jami B, NP    Allergies: Statins and Penicillins  Review of Systems  Constitutional:  Negative for chills and fever.  HENT:  Negative for ear pain and sore throat.   Eyes:  Negative for pain and visual disturbance.  Respiratory:  Negative for cough and shortness of breath.   Cardiovascular:  Negative for chest pain and palpitations.  Gastrointestinal:  Negative for abdominal pain and vomiting.  Genitourinary:  Negative for dysuria and hematuria.  Musculoskeletal:  Negative for arthralgias and back pain.  Skin:  Negative for color change and rash.  Neurological:  Negative for seizures and syncope.  All other systems  reviewed and are negative.   Updated Vital Signs BP (!) 162/68 (BP Location: Right Arm)   Pulse 66   Temp 97.7 F (36.5 C) (Oral)   Resp 18   LMP 06/09/1991   SpO2 97%   Physical Exam Vitals and nursing note reviewed.  Constitutional:      General: She is not in acute distress.    Appearance: She is well-developed.  HENT:     Head: Normocephalic and atraumatic.   Eyes:     Conjunctiva/sclera: Conjunctivae normal.  Cardiovascular:     Rate and Rhythm: Normal rate and regular rhythm.     Heart sounds: No murmur heard. Pulmonary:     Effort: Pulmonary effort is normal. No respiratory distress.     Breath sounds: Normal breath sounds.  Abdominal:     Palpations: Abdomen is soft.     Tenderness: There is no abdominal tenderness.  Musculoskeletal:        General: No swelling.     Cervical back: Neck supple.  Skin:    General: Skin is warm and dry.     Capillary Refill: Capillary refill takes less than 2 seconds.  Neurological:     Mental Status: She is alert.  Psychiatric:        Mood and Affect: Mood normal.     (all labs ordered are listed, but only abnormal results are displayed) Labs Reviewed - No data to display  EKG: None  Radiology: CT Cervical Spine Wo Contrast Result Date: 07/20/2024 CLINICAL DATA:  Status post trauma. EXAM: CT CERVICAL SPINE WITHOUT CONTRAST TECHNIQUE: Multidetector CT imaging of the cervical spine was performed without intravenous contrast. Multiplanar CT image reconstructions were also generated. RADIATION DOSE REDUCTION: This exam was performed according to the departmental dose-optimization program which includes automated exposure control, adjustment of the mA and/or kV according to patient size and/or use of iterative reconstruction technique. COMPARISON:  None Available. FINDINGS: Alignment: Normal. Skull base and vertebrae: No acute fracture. No primary bone lesion or focal pathologic process. Soft tissues and spinal canal: No  prevertebral fluid or swelling. No visible canal hematoma. Disc levels: Moderate severity endplate sclerosis, mild anterior osteophyte formation and moderate to marked severity posterior bony spurring are seen at the level of C6-C7. Very mild anterior osteophyte formation is seen at C4-C5 and C5-C6. Moderate severity anterior and posterior intervertebral disc space narrowing is seen at C6-C7. Mild intervertebral disc space narrowing is present throughout the remainder of the cervical spine. Bilateral marked severity multilevel facet joint hypertrophy is noted. Upper chest: Negative. Other: None. IMPRESSION: 1. No acute fracture or subluxation in the cervical spine. 2. Moderate severity degenerative changes at the level of C6-C7. Electronically Signed   By: Suzen Dials M.D.   On: 07/20/2024 18:00   CT Head Wo Contrast Result Date: 07/20/2024 CLINICAL DATA:  Status post trauma. EXAM: CT HEAD WITHOUT CONTRAST TECHNIQUE: Contiguous axial images were obtained from  the base of the skull through the vertex without intravenous contrast. RADIATION DOSE REDUCTION: This exam was performed according to the departmental dose-optimization program which includes automated exposure control, adjustment of the mA and/or kV according to patient size and/or use of iterative reconstruction technique. COMPARISON:  None Available. FINDINGS: Brain: There is generalized cerebral atrophy with widening of the extra-axial spaces and ventricular dilatation. There are areas of decreased attenuation within the white matter tracts of the supratentorial brain, consistent with microvascular disease changes. Vascular: No hyperdense vessel or unexpected calcification. Skull: Normal. Negative for fracture or focal lesion. Sinuses/Orbits: No acute finding. Other: A small focus of right frontal scalp soft tissue air is noted. IMPRESSION: 1. No acute intracranial abnormality. 2. Generalized cerebral atrophy and microvascular disease changes of  the supratentorial brain. Electronically Signed   By: Suzen Dials M.D.   On: 07/20/2024 17:57     Procedures   Medications Ordered in the ED - No data to display                                  Medical Decision Making Amount and/or Complexity of Data Reviewed Radiology: ordered.    Presents  because of fall.  According to family, patient fell today.  Tripped over a toy on the floor.  Hit her head.  Did not lose consciousness.  Endorsing forehead pain as well as mild headache.  No cervical thoracic or lumbar pain.  No anticoagulation.  No chest pain or shortness of breath before or after event.  Endorses some slight right hip pain but very minimal in nature and able to walk on it.  No numbness or tingling anywhere.  To urgent care.  Patient had small laceration on forehead.  Steri-Strips placed.   On exam, patient A and O x 3 GCS 15.  No focal deficits.  Full range of motion of the eyes without Diplopia.  No concerns in count of facial fracture with entrapment.  No pain to palpation cervical thoracic or lumbar spine.  Given age, will obtain CT head as well as CT C-spine.  No indication to obtain thoracic or lumbar imaging at this point time given exam.  No concerns for Fracture.  Given age as well as mechanism, did obtain CT head and CT C-spine.  No evidence of subdural epidural.  No evidence of acute cervical fracture.  Has no chronic changes but no acute pathology.  This is mechanical in nature.  No concerns for cardiogenic syncope.  No further workup needed.     Final diagnoses:  Fall, initial encounter  Injury of head, initial encounter    ED Discharge Orders     None          Simon Lavonia SAILOR, MD 07/20/24 1811

## 2024-07-20 NOTE — ED Provider Notes (Signed)
 [facility]    CSN: [id number] Arrival date & time: 07/20/24  1249      History   Chief Complaint Chief Complaint  Patient presents with   Fall   Laceration    HPI Mary Hunt is a 83 y.o. female.   HPI  Pt is here with granddaughter who is providing Spanish translation services per pt preference.   They report that the patient fell and hit her head around 11:45 this morning. They deny LOC, nausea, vomiting, vision changes. Her granddaughter denies notable behavior changes or confusion. She denies pain with eye movement. She does endorse a headache with 9-10/10 severity. They deny taking OTC medications prior to arrival for pain but she is using an ice pack against the right eye area.  She does not take any blood thinners and stopped taking Aspirin  in July.      Past Medical History:  Diagnosis Date   Arthritis    Colon polyps    Depression    Diabetes mellitus    TYPE 2   Hypertension    Osteoporosis 05/2019   T score -2.3.  Prior DEXA T score -2.6   Vitamin D  deficiency     Patient Active Problem List   Diagnosis Date Noted   Pain due to onychomycosis of toenails of both feet 03/13/2021   Hav (hallux abducto valgus), right 03/13/2021   Hammer toe of second toe of right foot 03/13/2021   Esophageal dysphagia 07/07/2019   Atypical chest pain 07/07/2019   Unintentional weight loss 07/07/2019   History of colonic polyps 07/07/2019   History of Abnormal LFTs 07/07/2019   Gastroesophageal reflux disease 07/07/2019   Age-related osteoporosis without current pathological fracture 02/12/2017   Vitamin D  deficiency 12/31/2016   Vaginal atrophy 02/01/2014   Psoriasis 10/12/2012   Internal hemorrhoids 12/06/2008   RECTAL BLEEDING 12/06/2008   COLONIC POLYPS 12/03/2008   Essential hypertension 12/03/2008   External hemorrhoids 12/03/2008   Diverticulosis of colon 12/03/2008   Arthropathy 12/03/2008   NONSPEC ELEVATION OF LEVELS OF TRANSAMINASE/LDH  12/03/2008    Past Surgical History:  Procedure Laterality Date   APPENDECTOMY     BREAST SURGERY     BREAST BIOPSY-BENIGN    OB History     Gravida  3   Para  3   Term  3   Preterm      AB  0   Living  2      SAB      IAB      Ectopic  0   Multiple      Live Births  3            Home Medications    Prior to Admission medications   Medication Sig Start Date End Date Taking? Authorizing Provider  CALCIUM PO Take by mouth.    [provider]  candesartan (ATACAND) 16 MG tablet Take by mouth. Patient not taking: Reported on 06/16/2023 03/11/22   [provider]  carvedilol (COREG) 6.25 MG tablet Take by mouth. 06/29/22   [provider]  Cholecalciferol (VITAMIN D -3 PO) Take by mouth.    [provider]  clobetasol  ointment (TEMOVATE ) 0.05 % Apply 1 Application topically 2 (two) times daily. 06/16/23   Chrzanowski, Jami B, NP  Dapagliflozin-metFORMIN HCl ER (XIGDUO XR) 08-999 MG TB24 Take by mouth. 01/09/22   [provider]  estradiol  (ESTRACE ) 0.1 MG/GM vaginal cream INSERT 1 APPLICATORFUL VAGINALLY 3 TIMES A WEEK 10/12/23   Chrzanowski,  Jami B, NP  ezetimibe (ZETIA) 10 MG tablet Take 1 tablet by mouth daily. 06/29/22   [provider]  fenofibrate (TRICOR) 145 MG tablet Take 1 tablet by mouth daily. 06/29/22   [provider]  ibuprofen  (ADVIL ,MOTRIN ) 600 MG tablet Take 1 tablet (600 mg total) by mouth every 8 (eight) hours as needed for pain. 10/12/12   Winfred Curlee DEL, MD  Lancets JANETT ULTRASOFT) lancets  12/19/16   [provider]  Lancets MISC by Does not apply route. 12/17/16   [provider]  meclizine  (ANTIVERT ) 25 MG tablet Take 1 tablet (25 mg total) by mouth 2 (two) times daily as needed for dizziness. 09/10/23   Arloa Suzen RAMAN, NP  Multiple Vitamin (MULTIVITAMIN PO) Take by mouth.    [provider]  Omega-3 Fatty Acids (FISH OIL PO) Take by mouth.    [provider]  omeprazole  (PRILOSEC) 40 MG capsule TAKE 1 CAPSULE(40 MG) BY MOUTH DAILY 11/13/19   Mansouraty, Gabriel Jr., MD  ondansetron  (ZOFRAN -ODT) 4 MG disintegrating tablet Take 1 tablet (4 mg total) by mouth every 8 (eight) hours as needed for nausea or vomiting. 09/10/23   Arloa Suzen RAMAN, NP  ONE TOUCH ULTRA TEST test strip  12/19/16   [provider]  Phenazopyridine HCl (AZO TABS PO) Take by mouth. Patient not taking: Reported on 06/16/2023    [provider]  ramelteon (ROZEREM) 8 MG tablet Take 8 mg by mouth at bedtime. Patient not taking: Reported on 06/16/2023 08/13/22   [provider]  sulfamethoxazole -trimethoprim  (BACTRIM  DS) 800-160 MG tablet Take 1 tablet by mouth 2 (two) times daily. Patient not taking: Reported on 06/16/2023 08/14/22   Ginette Shasta NOVAK, NP    Family History Family History  Problem Relation Age of Onset   Cancer Mother        stomach   Stomach cancer Mother    Breast cancer Sister    Breast cancer Maternal Aunt    Colon cancer Neg Hx    Esophageal cancer Neg Hx    Inflammatory bowel disease Neg Hx    Liver disease Neg Hx    Pancreatic cancer Neg Hx    Rectal cancer Neg Hx    Osteoporosis Neg Hx     Social History Social History   Tobacco Use   Smoking status: Never   Smokeless tobacco: Never  Vaping Use   Vaping status: Never Used  Substance Use Topics   Alcohol use: No   Drug use: No     Allergies   Statins and Penicillins   Review of Systems Review of Systems  Eyes:  Negative for visual disturbance.  Gastrointestinal:  Negative for nausea and vomiting.  Neurological:  Positive for headaches. Negative for syncope, speech difficulty and weakness.  Psychiatric/Behavioral:  Negative for agitation, behavioral problems and confusion.      Physical Exam Triage Vital Signs ED Triage Vitals  Encounter Vitals Group     BP 07/20/24 1258 (!) 145/75     Girls Systolic BP Percentile --      Girls Diastolic  BP Percentile --      Boys Systolic BP Percentile --      Boys Diastolic BP Percentile --      Pulse Rate 07/20/24 1258 63     Resp 07/20/24 1258 17     Temp 07/20/24 1258 98.1 F (36.7 C)     Temp Source 07/20/24 1258 Oral     SpO2 07/20/24 1258 98 %  Weight 07/20/24 1258 118 lb (53.5 kg)     Height 07/20/24 1258 4' 11 (1.499 m)     Head Circumference --      Peak Flow --      Pain Score 07/20/24 1319 9     Pain Loc --      Pain Education --      Exclude from Growth Chart --    No data found.  Updated Vital Signs BP (!) 145/75 (BP Location: Right Arm)   Pulse 63   Temp 98.1 F (36.7 C) (Oral)   Resp 17   Ht 4' 11 (1.499 m)   Wt 118 lb (53.5 kg)   LMP 06/09/1991   SpO2 98%   BMI 23.83 kg/m   Visual Acuity Right Eye Distance:   Left Eye Distance:   Bilateral Distance:    Right Eye Near:   Left Eye Near:    Bilateral Near:     Physical Exam Constitutional:      General: She is awake. She is not in acute distress.    Appearance: Normal appearance. She is well-developed and well-groomed. She is not ill-appearing or toxic-appearing.  HENT:     Head: Normocephalic. Contusion and laceration present.   Eyes:     General: Lids are normal. Gaze aligned appropriately.     Extraocular Movements: Extraocular movements intact.     Right eye: Normal extraocular motion and no nystagmus.     Left eye: Normal extraocular motion and no nystagmus.     Conjunctiva/sclera: Conjunctivae normal.     Pupils: Pupils are equal, round, and reactive to light.  Pulmonary:     Effort: Pulmonary effort is normal.  Neurological:     Mental Status: She is alert.     Cranial Nerves: No cranial nerve deficit, dysarthria or facial asymmetry.     Comments: Comments: MENTAL STATUS: AAOx3, memory intact, fund of knowledge appropriate   LANG/SPEECH: Naming and repetition intact, fluent, no dysarthria, follows 3-step commands, answers questions appropriately     CRANIAL NERVES:   II:  Pupils equal and reactive, no RAPD   III, IV, VI: EOM intact, no gaze preference or deviation, no nystagmus.   V: normal sensation in V1, V2, and V3 segments bilaterally   VII: no asymmetry, no nasolabial fold flattening   VIII: normal hearing to speech   IX, X: normal palatal elevation, no uvular deviation   XI: 5/5 head turn and 5/5 shoulder shrug bilaterally   XII: midline tongue protrusion   MOTOR:  4/5 bilateral grip strength 5/5 strength dorsiflexion/plantarflexion b/l   COORD: no tremor, no dysmetria   STATION: normal stance, no truncal ataxia   GAIT: Normal; patient able to tip-toe, heel-walk.   Psychiatric:        Attention and Perception: Attention and perception normal.        Mood and Affect: Mood and affect normal.        Speech: Speech normal.        Behavior: Behavior normal. Behavior is cooperative.      UC Treatments / Results  Labs (all labs ordered are listed, but only abnormal results are displayed) Labs Reviewed - No data to display  EKG   Radiology CT Cervical Spine Wo Contrast Result Date: 07/20/2024 CLINICAL DATA:  Status post trauma. EXAM: CT CERVICAL SPINE WITHOUT CONTRAST TECHNIQUE: Multidetector CT imaging of the cervical spine was performed without intravenous contrast. Multiplanar CT image reconstructions were also generated. RADIATION DOSE REDUCTION: This exam was  performed according to the departmental dose-optimization program which includes automated exposure control, adjustment of the mA and/or kV according to patient size and/or use of iterative reconstruction technique. COMPARISON:  None Available. FINDINGS: Alignment: Normal. Skull base and vertebrae: No acute fracture. No primary bone lesion or focal pathologic process. Soft tissues and spinal canal: No prevertebral fluid or swelling. No visible canal hematoma. Disc levels: Moderate severity endplate sclerosis, mild anterior osteophyte formation and moderate to marked severity posterior  bony spurring are seen at the level of C6-C7. Very mild anterior osteophyte formation is seen at C4-C5 and C5-C6. Moderate severity anterior and posterior intervertebral disc space narrowing is seen at C6-C7. Mild intervertebral disc space narrowing is present throughout the remainder of the cervical spine. Bilateral marked severity multilevel facet joint hypertrophy is noted. Upper chest: Negative. Other: None. IMPRESSION: 1. No acute fracture or subluxation in the cervical spine. 2. Moderate severity degenerative changes at the level of C6-C7. Electronically Signed   By: Suzen Dials M.D.   On: 07/20/2024 18:00   CT Head Wo Contrast Result Date: 07/20/2024 CLINICAL DATA:  Status post trauma. EXAM: CT HEAD WITHOUT CONTRAST TECHNIQUE: Contiguous axial images were obtained from the base of the skull through the vertex without intravenous contrast. RADIATION DOSE REDUCTION: This exam was performed according to the departmental dose-optimization program which includes automated exposure control, adjustment of the mA and/or kV according to patient size and/or use of iterative reconstruction technique. COMPARISON:  None Available. FINDINGS: Brain: There is generalized cerebral atrophy with widening of the extra-axial spaces and ventricular dilatation. There are areas of decreased attenuation within the white matter tracts of the supratentorial brain, consistent with microvascular disease changes. Vascular: No hyperdense vessel or unexpected calcification. Skull: Normal. Negative for fracture or focal lesion. Sinuses/Orbits: No acute finding. Other: A small focus of right frontal scalp soft tissue air is noted. IMPRESSION: 1. No acute intracranial abnormality. 2. Generalized cerebral atrophy and microvascular disease changes of the supratentorial brain. Electronically Signed   By: Suzen Dials M.D.   On: 07/20/2024 17:57    Procedures Laceration Repair  Date/Time: 07/20/2024 6:07 PM  Performed by:  Marylene Rocky BRAVO, PA-C Authorized by: Marylene Rocky BRAVO, PA-C   Consent:    Consent obtained:  Verbal   Consent given by:  Patient   Risks, benefits, and alternatives were discussed: yes     Risks discussed:  Infection, pain, poor wound healing, poor cosmetic result and need for additional repair   Alternatives discussed:  No treatment, delayed treatment, observation and referral Universal protocol:    Procedure explained and questions answered to patient or proxy's satisfaction: yes     Patient identity confirmed:  Verbally with patient Anesthesia:    Anesthesia method:  None Laceration details:    Location:  Face   Face location:  Forehead   Length (cm):  2   Depth (mm):  2 Exploration:    Hemostasis achieved with:  Direct pressure   Wound exploration: wound explored through full range of motion     Contaminated: no   Treatment:    Area cleansed with:  Chlorhexidine   Debridement:  None   Undermining:  None   Scar revision: no   Skin repair:    Repair method:  Steri-Strips   Number of Steri-Strips:  3 Approximation:    Approximation:  Close Repair type:    Repair type:  Simple Post-procedure details:    Dressing:  Open (no dressing)   Procedure completion:  Tolerated  (  including critical care time)  Medications Ordered in UC Medications  acetaminophen  (TYLENOL ) tablet 650 mg (650 mg Oral Given 07/20/24 1343)    Initial Impression / Assessment and Plan / UC Course  I have reviewed the triage vital signs and the nursing notes.  Pertinent labs & imaging results that were available during my care of the patient were reviewed by me and considered in my medical decision making (see chart for details).      Final Clinical Impressions(s) / UC Diagnoses   Final diagnoses:  Fall, initial encounter  Laceration of right periocular area without foreign body, initial encounter  Acute post-traumatic headache, not intractable   Patient presents with her granddaughter today who is  providing Spanish translation.  They report that the patient fell about 45 minutes PTA and hit her head causing a laceration and bruising just above the right eye.  Patient reports severe, 9-10/10 headache following the event.  She denies loss of consciousness, confusion, vision changes, nausea or vomiting.  Laceration was cleansed using Hibiclens and sterile saline flush.  Area was then repaired using 3 Steri-Strips.  Reviewed with patient and her granddaughter that Steri-Strips would come off on their own over the next 5 to 7 days.  Reviewed further home care measures to assist with recovery.  Neurological exam is largely reassuring without evidence of deficits.  I reviewed with patient and her granddaughter that without advanced imaging I cannot rule out intracranial damage such as a hemorrhage and recommend prompt evaluation with a CT scan.  Patient's granddaughter asked if they could potentially get this done via PCP.  Reviewed with patient and granddaughter that the emergency room would likely be the fastest place to get this completed but they could try calling PCP office for assistance if they wish to avoid ED but they may still be referred there.  ED and return precautions reviewed and provided in AVS.  Recommend close follow-up with PCP for ongoing monitoring and management as needed    Discharge Instructions      You were seen today for concerns of a headache as well as a laceration following a fall.  At urgent care we are not able to provide advanced imaging such as a CT scan to ensure that you are not having internal bleeding.  Given the fact that you are having a severe headache I do recommend that you go to the emergency room for evaluation with advanced imaging or call your PCP to order this to make sure that you do not have any bleeding ongoing.  We have provided you with a dose of Tylenol  to help with your pain as well as Steri-Strips to close the laceration to your forehead. The  Steri-Strips should come off on their own in the next 5 to 7 days.  Please refrain from getting the area wet until tomorrow morning.  You can wash the area with warm water and gentle soap but do not scrub the area or apply harsh cleanser such as alcohol or peroxide. If you notice any of the following please return urgent care: Drainage that looks like pus, swelling, redness spreading from the laceration site, profuse bleeding.  With regards to your fall and head injury if you notice any of the following please go to the emergency room: Confusion, loss of consciousness, vision changes, severe headache, nausea or vomiting, weakness or numbness on one side of the body, facial drooping, difficulty speaking     ED Prescriptions   None  PDMP not reviewed this encounter.   Marylene Rocky BRAVO, PA-C 07/20/24 1813

## 2024-07-20 NOTE — ED Triage Notes (Signed)
 Pt is accompanied by granddaughter on today's visit. IPAD translator offered, pt denied. Would like granddaughter to translate.   Pt presents with a chief complaint of fall today. Tripped and fell in house at 11:45 PM. Approximately 2 cm laceration above right eye present on assessment. Bruising noted around the right eye. Pt is complaining of headache at this time. Rates overall head pain a 9/10. Ice pack applied PTA. No medications taken for pain today. Denies taking blood thinners. Bleeding is controlled.

## 2024-11-28 ENCOUNTER — Telehealth: Payer: Self-pay | Admitting: *Deleted

## 2024-11-28 DIAGNOSIS — M810 Age-related osteoporosis without current pathological fracture: Secondary | ICD-10-CM

## 2024-11-28 NOTE — Telephone Encounter (Signed)
 Ok for upcoming infusion please schedule B&P with TW for 6/26

## 2024-11-28 NOTE — Telephone Encounter (Signed)
 Please advise if patient needs office visit or annual prior to next Reclast  infusion. Last OV in our office was 06/16/23.   Reclast  received:  12/13/23 09/25/22 08/01/21 07/19/20  Routing to covering provider to review and advise.

## 2024-11-28 NOTE — Telephone Encounter (Signed)
 Message to appointments.   Future order placed for labs prior to Reclast .

## 2024-12-13 ENCOUNTER — Encounter: Payer: Self-pay | Admitting: Nurse Practitioner
